# Patient Record
Sex: Female | Born: 1935 | Race: White | Hispanic: No | Smoking: Former smoker
Health system: Southern US, Community
[De-identification: ages and names within clinical notes are randomized; demographics above are authoritative.]

## PROBLEM LIST (undated history)

## (undated) DIAGNOSIS — H409 Unspecified glaucoma: Secondary | ICD-10-CM

## (undated) DIAGNOSIS — I252 Old myocardial infarction: Secondary | ICD-10-CM

## (undated) DIAGNOSIS — I10 Essential (primary) hypertension: Secondary | ICD-10-CM

## (undated) DIAGNOSIS — M1990 Unspecified osteoarthritis, unspecified site: Secondary | ICD-10-CM

## (undated) DIAGNOSIS — K5520 Angiodysplasia of colon without hemorrhage: Secondary | ICD-10-CM

## (undated) DIAGNOSIS — I639 Cerebral infarction, unspecified: Secondary | ICD-10-CM

## (undated) DIAGNOSIS — F0390 Unspecified dementia without behavioral disturbance: Secondary | ICD-10-CM

## (undated) DIAGNOSIS — R970 Elevated carcinoembryonic antigen [CEA]: Secondary | ICD-10-CM

## (undated) DIAGNOSIS — I2510 Atherosclerotic heart disease of native coronary artery without angina pectoris: Secondary | ICD-10-CM

## (undated) DIAGNOSIS — Z8679 Personal history of other diseases of the circulatory system: Secondary | ICD-10-CM

## (undated) DIAGNOSIS — F419 Anxiety disorder, unspecified: Secondary | ICD-10-CM

## (undated) DIAGNOSIS — R918 Other nonspecific abnormal finding of lung field: Secondary | ICD-10-CM

## (undated) DIAGNOSIS — E7800 Pure hypercholesterolemia, unspecified: Secondary | ICD-10-CM

## (undated) DIAGNOSIS — Z8719 Personal history of other diseases of the digestive system: Secondary | ICD-10-CM

## (undated) DIAGNOSIS — D509 Iron deficiency anemia, unspecified: Secondary | ICD-10-CM

## (undated) DIAGNOSIS — Z9289 Personal history of other medical treatment: Secondary | ICD-10-CM

## (undated) DIAGNOSIS — IMO0001 Reserved for inherently not codable concepts without codable children: Secondary | ICD-10-CM

## (undated) DIAGNOSIS — M810 Age-related osteoporosis without current pathological fracture: Secondary | ICD-10-CM

## (undated) DIAGNOSIS — E119 Type 2 diabetes mellitus without complications: Secondary | ICD-10-CM

## (undated) HISTORY — DX: Personal history of other diseases of the circulatory system: Z86.79

## (undated) HISTORY — DX: Cerebral infarction, unspecified: I63.9

## (undated) HISTORY — PX: TONSILLECTOMY: SUR1361

## (undated) HISTORY — DX: Unspecified glaucoma: H40.9

## (undated) HISTORY — PX: COLONOSCOPY: SHX174

## (undated) HISTORY — DX: Other nonspecific abnormal finding of lung field: R91.8

## (undated) HISTORY — DX: Old myocardial infarction: I25.2

## (undated) HISTORY — DX: Angiodysplasia of colon without hemorrhage: K55.20

## (undated) HISTORY — DX: Essential (primary) hypertension: I10

## (undated) HISTORY — DX: Elevated carcinoembryonic antigen (CEA): R97.0

## (undated) HISTORY — PX: THYROIDECTOMY, PARTIAL: SHX18

## (undated) HISTORY — PX: APPENDECTOMY: SHX54

## (undated) HISTORY — DX: Anxiety disorder, unspecified: F41.9

## (undated) HISTORY — DX: Pure hypercholesterolemia, unspecified: E78.00

## (undated) HISTORY — DX: Personal history of other diseases of the digestive system: Z87.19

## (undated) HISTORY — DX: Type 2 diabetes mellitus without complications: E11.9

## (undated) HISTORY — DX: Reserved for inherently not codable concepts without codable children: IMO0001

## (undated) HISTORY — DX: Atherosclerotic heart disease of native coronary artery without angina pectoris: I25.10

## (undated) HISTORY — DX: Iron deficiency anemia, unspecified: D50.9

## (undated) HISTORY — DX: Unspecified osteoarthritis, unspecified site: M19.90

## (undated) HISTORY — PX: POLYPECTOMY: SHX149

## (undated) HISTORY — DX: Age-related osteoporosis without current pathological fracture: M81.0

## (undated) HISTORY — DX: Personal history of other medical treatment: Z92.89

---

## 1988-12-24 HISTORY — PX: OTHER SURGICAL HISTORY: SHX169

## 2004-10-10 ENCOUNTER — Ambulatory Visit: Payer: Self-pay | Admitting: Internal Medicine

## 2004-10-24 ENCOUNTER — Ambulatory Visit: Payer: Self-pay | Admitting: Internal Medicine

## 2004-12-13 ENCOUNTER — Ambulatory Visit: Payer: Self-pay | Admitting: Internal Medicine

## 2004-12-24 ENCOUNTER — Ambulatory Visit: Payer: Self-pay | Admitting: Internal Medicine

## 2005-02-13 ENCOUNTER — Ambulatory Visit: Payer: Self-pay | Admitting: Internal Medicine

## 2005-02-21 ENCOUNTER — Ambulatory Visit: Payer: Self-pay | Admitting: Internal Medicine

## 2005-04-10 ENCOUNTER — Ambulatory Visit: Payer: Self-pay | Admitting: Internal Medicine

## 2005-04-23 ENCOUNTER — Ambulatory Visit: Payer: Self-pay | Admitting: Internal Medicine

## 2005-05-06 ENCOUNTER — Emergency Department: Payer: Self-pay | Admitting: Emergency Medicine

## 2005-06-05 ENCOUNTER — Ambulatory Visit: Payer: Self-pay | Admitting: Internal Medicine

## 2005-06-08 ENCOUNTER — Ambulatory Visit: Payer: Self-pay | Admitting: Internal Medicine

## 2005-06-23 ENCOUNTER — Ambulatory Visit: Payer: Self-pay | Admitting: Internal Medicine

## 2005-08-14 ENCOUNTER — Ambulatory Visit: Payer: Self-pay | Admitting: Internal Medicine

## 2005-08-24 ENCOUNTER — Ambulatory Visit: Payer: Self-pay | Admitting: Internal Medicine

## 2005-10-09 ENCOUNTER — Ambulatory Visit: Payer: Self-pay | Admitting: Internal Medicine

## 2005-10-16 ENCOUNTER — Ambulatory Visit: Payer: Self-pay | Admitting: Internal Medicine

## 2005-10-22 ENCOUNTER — Ambulatory Visit: Payer: Self-pay | Admitting: Internal Medicine

## 2005-10-24 ENCOUNTER — Ambulatory Visit: Payer: Self-pay | Admitting: Internal Medicine

## 2005-11-23 ENCOUNTER — Ambulatory Visit: Payer: Self-pay | Admitting: Internal Medicine

## 2005-12-28 ENCOUNTER — Ambulatory Visit: Payer: Self-pay | Admitting: Unknown Physician Specialty

## 2005-12-31 ENCOUNTER — Ambulatory Visit: Payer: Self-pay | Admitting: Unknown Physician Specialty

## 2006-01-14 ENCOUNTER — Ambulatory Visit: Payer: Self-pay | Admitting: Internal Medicine

## 2006-01-23 ENCOUNTER — Ambulatory Visit: Payer: Self-pay | Admitting: Unknown Physician Specialty

## 2006-01-24 ENCOUNTER — Ambulatory Visit: Payer: Self-pay | Admitting: Internal Medicine

## 2006-02-21 ENCOUNTER — Ambulatory Visit: Payer: Self-pay | Admitting: Internal Medicine

## 2006-04-03 ENCOUNTER — Ambulatory Visit: Payer: Self-pay | Admitting: Internal Medicine

## 2006-04-23 ENCOUNTER — Ambulatory Visit: Payer: Self-pay | Admitting: Internal Medicine

## 2006-06-19 ENCOUNTER — Ambulatory Visit: Payer: Self-pay | Admitting: Internal Medicine

## 2006-06-23 ENCOUNTER — Ambulatory Visit: Payer: Self-pay | Admitting: Internal Medicine

## 2008-06-23 ENCOUNTER — Ambulatory Visit: Payer: Self-pay | Admitting: Internal Medicine

## 2008-07-01 ENCOUNTER — Ambulatory Visit: Payer: Self-pay | Admitting: Internal Medicine

## 2008-07-24 ENCOUNTER — Ambulatory Visit: Payer: Self-pay | Admitting: Internal Medicine

## 2008-08-24 ENCOUNTER — Ambulatory Visit: Payer: Self-pay | Admitting: Internal Medicine

## 2008-09-23 ENCOUNTER — Ambulatory Visit: Payer: Self-pay | Admitting: Internal Medicine

## 2008-10-24 ENCOUNTER — Ambulatory Visit: Payer: Self-pay | Admitting: Internal Medicine

## 2008-11-23 ENCOUNTER — Ambulatory Visit: Payer: Self-pay | Admitting: Internal Medicine

## 2008-12-24 ENCOUNTER — Ambulatory Visit: Payer: Self-pay | Admitting: Internal Medicine

## 2009-01-24 ENCOUNTER — Ambulatory Visit: Payer: Self-pay | Admitting: Internal Medicine

## 2009-02-21 ENCOUNTER — Ambulatory Visit: Payer: Self-pay | Admitting: Internal Medicine

## 2009-03-24 ENCOUNTER — Ambulatory Visit: Payer: Self-pay | Admitting: Internal Medicine

## 2009-04-23 ENCOUNTER — Ambulatory Visit: Payer: Self-pay | Admitting: Internal Medicine

## 2009-05-24 ENCOUNTER — Ambulatory Visit: Payer: Self-pay | Admitting: Internal Medicine

## 2009-06-23 ENCOUNTER — Ambulatory Visit: Payer: Self-pay | Admitting: Internal Medicine

## 2009-07-24 ENCOUNTER — Ambulatory Visit: Payer: Self-pay | Admitting: Internal Medicine

## 2009-08-24 ENCOUNTER — Ambulatory Visit: Payer: Self-pay | Admitting: Internal Medicine

## 2009-09-23 ENCOUNTER — Ambulatory Visit: Payer: Self-pay | Admitting: Internal Medicine

## 2009-10-24 ENCOUNTER — Ambulatory Visit: Payer: Self-pay | Admitting: Internal Medicine

## 2009-11-23 ENCOUNTER — Ambulatory Visit: Payer: Self-pay | Admitting: Internal Medicine

## 2009-12-24 ENCOUNTER — Ambulatory Visit: Payer: Self-pay | Admitting: Internal Medicine

## 2010-01-24 ENCOUNTER — Ambulatory Visit: Payer: Self-pay | Admitting: Internal Medicine

## 2010-02-21 ENCOUNTER — Ambulatory Visit: Payer: Self-pay | Admitting: Internal Medicine

## 2010-03-24 ENCOUNTER — Ambulatory Visit: Payer: Self-pay | Admitting: Internal Medicine

## 2010-04-23 ENCOUNTER — Ambulatory Visit: Payer: Self-pay | Admitting: Internal Medicine

## 2010-05-24 ENCOUNTER — Ambulatory Visit: Payer: Self-pay | Admitting: Internal Medicine

## 2010-06-23 ENCOUNTER — Ambulatory Visit: Payer: Self-pay | Admitting: Internal Medicine

## 2010-07-24 ENCOUNTER — Ambulatory Visit: Payer: Self-pay | Admitting: Internal Medicine

## 2010-08-04 ENCOUNTER — Inpatient Hospital Stay: Payer: Self-pay | Admitting: Internal Medicine

## 2010-08-09 ENCOUNTER — Ambulatory Visit: Payer: Self-pay | Admitting: Internal Medicine

## 2010-08-24 ENCOUNTER — Ambulatory Visit: Payer: Self-pay | Admitting: Internal Medicine

## 2010-09-23 ENCOUNTER — Ambulatory Visit: Payer: Self-pay | Admitting: Internal Medicine

## 2010-10-24 ENCOUNTER — Ambulatory Visit: Payer: Self-pay | Admitting: Internal Medicine

## 2010-11-13 ENCOUNTER — Ambulatory Visit: Payer: Self-pay

## 2010-11-23 ENCOUNTER — Ambulatory Visit: Payer: Self-pay | Admitting: Internal Medicine

## 2010-12-24 ENCOUNTER — Ambulatory Visit: Payer: Self-pay | Admitting: Internal Medicine

## 2011-01-24 ENCOUNTER — Ambulatory Visit: Payer: Self-pay | Admitting: Internal Medicine

## 2011-02-22 ENCOUNTER — Ambulatory Visit: Payer: Self-pay | Admitting: Internal Medicine

## 2011-03-25 ENCOUNTER — Ambulatory Visit: Payer: Self-pay | Admitting: Internal Medicine

## 2011-04-24 ENCOUNTER — Ambulatory Visit: Payer: Self-pay | Admitting: Internal Medicine

## 2011-04-27 DIAGNOSIS — Z9289 Personal history of other medical treatment: Secondary | ICD-10-CM

## 2011-04-27 HISTORY — DX: Personal history of other medical treatment: Z92.89

## 2011-05-25 ENCOUNTER — Ambulatory Visit: Payer: Self-pay | Admitting: Internal Medicine

## 2011-06-24 ENCOUNTER — Ambulatory Visit: Payer: Self-pay | Admitting: Internal Medicine

## 2011-07-25 ENCOUNTER — Ambulatory Visit: Payer: Self-pay | Admitting: Internal Medicine

## 2011-08-25 ENCOUNTER — Ambulatory Visit: Payer: Self-pay | Admitting: Internal Medicine

## 2011-09-24 ENCOUNTER — Ambulatory Visit: Payer: Self-pay | Admitting: Internal Medicine

## 2011-10-08 ENCOUNTER — Ambulatory Visit: Payer: Self-pay | Admitting: Internal Medicine

## 2011-10-25 ENCOUNTER — Ambulatory Visit: Payer: Self-pay | Admitting: Internal Medicine

## 2011-11-24 ENCOUNTER — Ambulatory Visit: Payer: Self-pay | Admitting: Internal Medicine

## 2011-12-25 ENCOUNTER — Ambulatory Visit: Payer: Self-pay | Admitting: Internal Medicine

## 2011-12-26 LAB — CANCER CENTER HEMOGLOBIN: HGB: 11.4 g/dL — ABNORMAL LOW (ref 12.0–16.0)

## 2012-01-09 LAB — FERRITIN: Ferritin (ARMC): 14 ng/mL (ref 8–388)

## 2012-01-25 ENCOUNTER — Ambulatory Visit: Payer: Self-pay | Admitting: Internal Medicine

## 2012-02-05 ENCOUNTER — Ambulatory Visit: Payer: Self-pay | Admitting: Internal Medicine

## 2012-02-05 LAB — CANCER CENTER HEMOGLOBIN: HGB: 10.1 g/dL — ABNORMAL LOW (ref 12.0–16.0)

## 2012-02-12 LAB — CANCER CENTER HEMOGLOBIN: HGB: 11.1 g/dL — ABNORMAL LOW (ref 12.0–16.0)

## 2012-02-18 LAB — URINALYSIS, COMPLETE
Bilirubin,UR: NEGATIVE
Nitrite: NEGATIVE
Ph: 6 (ref 4.5–8.0)
Protein: 30
Specific Gravity: >=1.03 (ref 1.003–1.030)

## 2012-02-18 LAB — CANCER CENTER HEMOGLOBIN: HGB: 11.3 g/dL — ABNORMAL LOW (ref 12.0–16.0)

## 2012-02-19 LAB — URINE CULTURE

## 2012-02-21 LAB — COMPREHENSIVE METABOLIC PANEL
Alkaline Phosphatase: 81 U/L (ref 50–136)
Anion Gap: 9 (ref 7–16)
BUN: 20 mg/dL — ABNORMAL HIGH (ref 7–18)
Bilirubin,Total: 0.3 mg/dL (ref 0.2–1.0)
Calcium, Total: 9.4 mg/dL (ref 8.5–10.1)
Chloride: 98 mmol/L (ref 98–107)
Co2: 27 mmol/L (ref 21–32)
Creatinine: 0.99 mg/dL (ref 0.60–1.30)
EGFR (African American): >60
EGFR (Non-African Amer.): 56 — ABNORMAL LOW
Osmolality: 273 (ref 275–301)
SGOT(AST): 164 U/L — ABNORMAL HIGH (ref 15–37)
Sodium: 134 mmol/L — ABNORMAL LOW (ref 136–145)

## 2012-02-22 ENCOUNTER — Ambulatory Visit: Payer: Self-pay | Admitting: Internal Medicine

## 2012-02-22 ENCOUNTER — Inpatient Hospital Stay: Payer: Self-pay | Admitting: Internal Medicine

## 2012-02-22 LAB — CBC
HCT: 26.9 % — ABNORMAL LOW (ref 35.0–47.0)
HGB: 8.9 g/dL — ABNORMAL LOW (ref 12.0–16.0)
MCH: 30.4 pg (ref 26.0–34.0)
MCV: 92 fL (ref 80–100)
Platelet: 130 10*3/uL — ABNORMAL LOW (ref 150–440)
RDW: 20.5 % — ABNORMAL HIGH (ref 11.5–14.5)
WBC: 2 10*3/uL — CL (ref 3.6–11.0)

## 2012-02-22 LAB — PROTIME-INR
INR: 1.1
Prothrombin Time: 14.1 secs (ref 11.5–14.7)

## 2012-02-22 LAB — DIFFERENTIAL
Basophil %: 0 %
Eosinophil #: 0 10*3/uL (ref 0.0–0.7)
Eosinophil %: 0 %
Lymphocyte #: 0.3 10*3/uL — ABNORMAL LOW (ref 1.0–3.6)
Lymphocyte %: 15.6 %
Monocyte #: 0.2 10*3/uL (ref 0.0–0.7)
Neutrophil #: 1.5 10*3/uL (ref 1.4–6.5)
Neutrophil %: 72.3 %

## 2012-02-22 LAB — TROPONIN I
Troponin-I: <0.02 ng/mL
Troponin-I: <0.02 ng/mL

## 2012-02-22 LAB — HEMOGLOBIN: HGB: 9.8 g/dL — ABNORMAL LOW (ref 12.0–16.0)

## 2012-02-22 LAB — CK TOTAL AND CKMB (NOT AT ARMC): CK-MB: <0.5 ng/mL — ABNORMAL LOW (ref 0.5–3.6)

## 2012-02-23 LAB — COMPREHENSIVE METABOLIC PANEL
Bilirubin,Total: 0.3 mg/dL (ref 0.2–1.0)
Chloride: 107 mmol/L (ref 98–107)
Co2: 24 mmol/L (ref 21–32)
Creatinine: 0.82 mg/dL (ref 0.60–1.30)
EGFR (African American): >60
EGFR (Non-African Amer.): >60
Osmolality: 285 (ref 275–301)
Potassium: 3.8 mmol/L (ref 3.5–5.1)
SGOT(AST): 162 U/L — ABNORMAL HIGH (ref 15–37)
SGPT (ALT): 130 U/L — ABNORMAL HIGH
Total Protein: 5.3 g/dL — ABNORMAL LOW (ref 6.4–8.2)

## 2012-02-23 LAB — CBC WITH DIFFERENTIAL/PLATELET
Basophil #: 0 10*3/uL (ref 0.0–0.1)
Basophil %: 0 %
HCT: 26.6 % — ABNORMAL LOW (ref 35.0–47.0)
Lymphocyte #: 0.2 10*3/uL — ABNORMAL LOW (ref 1.0–3.6)
Lymphocyte %: 11 %
MCH: 30.3 pg (ref 26.0–34.0)
MCHC: 33 g/dL (ref 32.0–36.0)
MCV: 92 fL (ref 80–100)
Monocyte #: 0.2 10*3/uL (ref 0.0–0.7)
Neutrophil #: 1.6 10*3/uL (ref 1.4–6.5)
Platelet: 146 10*3/uL — ABNORMAL LOW (ref 150–440)
RBC: 2.89 10*6/uL — ABNORMAL LOW (ref 3.80–5.20)
RDW: 19.3 % — ABNORMAL HIGH (ref 11.5–14.5)
WBC: 2 10*3/uL — CL (ref 3.6–11.0)

## 2012-02-23 LAB — URINALYSIS, COMPLETE
Blood: NEGATIVE
Ketone: NEGATIVE
Ph: 5 (ref 4.5–8.0)
Protein: NEGATIVE
RBC,UR: NONE SEEN /HPF (ref 0–5)
Specific Gravity: 1.015 (ref 1.003–1.030)
WBC UR: 4 /HPF (ref 0–5)

## 2012-02-24 LAB — CBC WITH DIFFERENTIAL/PLATELET
Basophil #: 0 10*3/uL (ref 0.0–0.1)
Eosinophil %: 0 %
HCT: 26.3 % — ABNORMAL LOW (ref 35.0–47.0)
HGB: 8.6 g/dL — ABNORMAL LOW (ref 12.0–16.0)
Lymphocyte #: 0.3 10*3/uL — ABNORMAL LOW (ref 1.0–3.6)
Lymphocyte %: 11.2 %
MCH: 30.3 pg (ref 26.0–34.0)
Monocyte #: 0.2 10*3/uL (ref 0.0–0.7)
Monocyte %: 8.9 %
Neutrophil #: 2.1 10*3/uL (ref 1.4–6.5)
Neutrophil %: 79.8 %
RBC: 2.85 10*6/uL — ABNORMAL LOW (ref 3.80–5.20)
RDW: 19.5 % — ABNORMAL HIGH (ref 11.5–14.5)

## 2012-02-24 LAB — URINE CULTURE

## 2012-02-24 LAB — COMPREHENSIVE METABOLIC PANEL
Alkaline Phosphatase: 92 U/L (ref 50–136)
Calcium, Total: 7.9 mg/dL — ABNORMAL LOW (ref 8.5–10.1)
Co2: 23 mmol/L (ref 21–32)
EGFR (Non-African Amer.): >60
Osmolality: 280 (ref 275–301)
SGOT(AST): 134 U/L — ABNORMAL HIGH (ref 15–37)
SGPT (ALT): 124 U/L — ABNORMAL HIGH

## 2012-02-25 LAB — COMPREHENSIVE METABOLIC PANEL
Albumin: 1.9 g/dL — ABNORMAL LOW (ref 3.4–5.0)
Alkaline Phosphatase: 91 U/L (ref 50–136)
BUN: 13 mg/dL (ref 7–18)
Calcium, Total: 7.9 mg/dL — ABNORMAL LOW (ref 8.5–10.1)
EGFR (African American): >60
EGFR (Non-African Amer.): >60
Glucose: 224 mg/dL — ABNORMAL HIGH (ref 65–99)
Potassium: 4 mmol/L (ref 3.5–5.1)
SGOT(AST): 84 U/L — ABNORMAL HIGH (ref 15–37)
SGPT (ALT): 100 U/L — ABNORMAL HIGH
Sodium: 142 mmol/L (ref 136–145)
Total Protein: 5.3 g/dL — ABNORMAL LOW (ref 6.4–8.2)

## 2012-02-25 LAB — CBC WITH DIFFERENTIAL/PLATELET
Basophil #: 0 10*3/uL (ref 0.0–0.1)
Basophil %: 0 %
HCT: 25.2 % — ABNORMAL LOW (ref 35.0–47.0)
HGB: 8.2 g/dL — ABNORMAL LOW (ref 12.0–16.0)
Lymphocyte %: 7.7 %
MCHC: 32.7 g/dL (ref 32.0–36.0)
MCV: 91 fL (ref 80–100)
Monocyte %: 1.6 %
Neutrophil #: 2.7 10*3/uL (ref 1.4–6.5)
Neutrophil %: 90.7 %

## 2012-02-26 LAB — CBC WITH DIFFERENTIAL/PLATELET
Basophil #: 0 10*3/uL (ref 0.0–0.1)
Basophil %: 0.3 %
Eosinophil #: 0 10*3/uL (ref 0.0–0.7)
HCT: 28 % — ABNORMAL LOW (ref 35.0–47.0)
HGB: 8.8 g/dL — ABNORMAL LOW (ref 12.0–16.0)
Lymphocyte #: 0.4 10*3/uL — ABNORMAL LOW (ref 1.0–3.6)
Lymphocyte %: 6.3 %
MCH: 29.2 pg (ref 26.0–34.0)
MCHC: 31.6 g/dL — ABNORMAL LOW (ref 32.0–36.0)
MCV: 93 fL (ref 80–100)
Neutrophil #: 5.8 10*3/uL (ref 1.4–6.5)
Platelet: 284 10*3/uL (ref 150–440)
RDW: 18.7 % — ABNORMAL HIGH (ref 11.5–14.5)
WBC: 6.3 10*3/uL (ref 3.6–11.0)

## 2012-02-26 LAB — COMPREHENSIVE METABOLIC PANEL
Albumin: 2.1 g/dL — ABNORMAL LOW (ref 3.4–5.0)
Alkaline Phosphatase: 92 U/L (ref 50–136)
Anion Gap: 9 (ref 7–16)
Calcium, Total: 8.9 mg/dL (ref 8.5–10.1)
Chloride: 109 mmol/L — ABNORMAL HIGH (ref 98–107)
EGFR (African American): >60
Glucose: 226 mg/dL — ABNORMAL HIGH (ref 65–99)
Potassium: 3.8 mmol/L (ref 3.5–5.1)
SGOT(AST): 79 U/L — ABNORMAL HIGH (ref 15–37)
SGPT (ALT): 110 U/L — ABNORMAL HIGH
Sodium: 141 mmol/L (ref 136–145)
Total Protein: 5.7 g/dL — ABNORMAL LOW (ref 6.4–8.2)

## 2012-02-27 LAB — CBC WITH DIFFERENTIAL/PLATELET
Basophil %: 0.3 %
Eosinophil #: 0 10*3/uL (ref 0.0–0.7)
Eosinophil %: 0.2 %
HCT: 28.1 % — ABNORMAL LOW (ref 35.0–47.0)
HGB: 8.8 g/dL — ABNORMAL LOW (ref 12.0–16.0)
Lymphocyte %: 3.4 %
MCV: 91 fL (ref 80–100)
Monocyte #: 0.2 10*3/uL (ref 0.0–0.7)
Neutrophil #: 7 10*3/uL — ABNORMAL HIGH (ref 1.4–6.5)
Neutrophil %: 93.7 %
Platelet: 384 10*3/uL (ref 150–440)
RBC: 3.08 10*6/uL — ABNORMAL LOW (ref 3.80–5.20)
RDW: 18.5 % — ABNORMAL HIGH (ref 11.5–14.5)
WBC: 7.5 10*3/uL (ref 3.6–11.0)

## 2012-02-27 LAB — BASIC METABOLIC PANEL
BUN: 23 mg/dL — ABNORMAL HIGH (ref 7–18)
Calcium, Total: 8.9 mg/dL (ref 8.5–10.1)
Co2: 22 mmol/L (ref 21–32)
Creatinine: 0.67 mg/dL (ref 0.60–1.30)
EGFR (African American): >60
Glucose: 193 mg/dL — ABNORMAL HIGH (ref 65–99)
Osmolality: 290 (ref 275–301)
Potassium: 4 mmol/L (ref 3.5–5.1)

## 2012-02-27 LAB — HEPATIC FUNCTION PANEL A (ARMC)
Albumin: 2.1 g/dL — ABNORMAL LOW (ref 3.4–5.0)
Bilirubin, Direct: <0.1 mg/dL (ref 0.00–0.20)
Total Protein: 5.4 g/dL — ABNORMAL LOW (ref 6.4–8.2)

## 2012-02-28 ENCOUNTER — Ambulatory Visit: Payer: Self-pay | Admitting: Internal Medicine

## 2012-02-29 LAB — CULTURE, BLOOD (SINGLE)

## 2012-03-04 LAB — FERRITIN: Ferritin (ARMC): 200 ng/mL (ref 8–388)

## 2012-03-11 LAB — URINALYSIS, COMPLETE
Bilirubin,UR: NEGATIVE
Glucose,UR: NEGATIVE mg/dL (ref 0–75)
Hyaline Cast: >30
Ketone: NEGATIVE
Leukocyte Esterase: NEGATIVE
Nitrite: NEGATIVE

## 2012-03-11 LAB — HEPATIC FUNCTION PANEL A (ARMC)
Bilirubin,Total: 0.5 mg/dL (ref 0.2–1.0)
SGOT(AST): 24 U/L (ref 15–37)
SGPT (ALT): 46 U/L

## 2012-03-11 LAB — CANCER CENTER HEMOGLOBIN: HGB: 11.8 g/dL — ABNORMAL LOW (ref 12.0–16.0)

## 2012-03-11 LAB — FERRITIN: Ferritin (ARMC): 93 ng/mL (ref 8–388)

## 2012-03-18 LAB — URINALYSIS, COMPLETE
Bacteria: NEGATIVE
Bilirubin,UR: NEGATIVE
Blood: NEGATIVE
Ketone: NEGATIVE
Leukocyte Esterase: NEGATIVE
Nitrite: NEGATIVE
Ph: 7.5 (ref 4.5–8.0)
Protein: 30

## 2012-03-18 LAB — CANCER CENTER HEMOGLOBIN: HGB: 11.3 g/dL — ABNORMAL LOW (ref 12.0–16.0)

## 2012-03-24 ENCOUNTER — Ambulatory Visit: Payer: Self-pay | Admitting: Internal Medicine

## 2012-03-27 LAB — CANCER CENTER HEMOGLOBIN: HGB: 10.6 g/dL — ABNORMAL LOW (ref 12.0–16.0)

## 2012-04-08 LAB — CANCER CENTER HEMOGLOBIN: HGB: 10 g/dL — ABNORMAL LOW (ref 12.0–16.0)

## 2012-04-08 LAB — FERRITIN: Ferritin (ARMC): 44 ng/mL (ref 8–388)

## 2012-04-15 LAB — URINALYSIS, COMPLETE
Bacteria: NEGATIVE
Bilirubin,UR: NEGATIVE
Blood: NEGATIVE
Glucose,UR: NEGATIVE mg/dL (ref 0–75)
Protein: NEGATIVE
Specific Gravity: 1.02 (ref 1.003–1.030)

## 2012-04-15 LAB — CBC CANCER CENTER
Basophil %: 0.7 %
Eosinophil #: 0.1 x10 3/mm (ref 0.0–0.7)
HGB: 10.8 g/dL — ABNORMAL LOW (ref 12.0–16.0)
MCH: 29.1 pg (ref 26.0–34.0)
MCHC: 32.4 g/dL (ref 32.0–36.0)
Monocyte #: 0.6 x10 3/mm (ref 0.2–0.9)
Platelet: 316 x10 3/mm (ref 150–440)
RBC: 3.72 10*6/uL — ABNORMAL LOW (ref 3.80–5.20)
WBC: 5.2 x10 3/mm (ref 3.6–11.0)

## 2012-04-23 ENCOUNTER — Ambulatory Visit: Payer: Self-pay | Admitting: Gastroenterology

## 2012-04-24 ENCOUNTER — Ambulatory Visit: Payer: Self-pay | Admitting: Internal Medicine

## 2012-04-24 LAB — PATHOLOGY REPORT

## 2012-05-13 LAB — CANCER CENTER HEMOGLOBIN: HGB: 11.7 g/dL — ABNORMAL LOW

## 2012-05-24 ENCOUNTER — Ambulatory Visit: Payer: Self-pay | Admitting: Internal Medicine

## 2012-05-27 LAB — CANCER CENTER HEMOGLOBIN: HGB: 11.2 g/dL — ABNORMAL LOW (ref 12.0–16.0)

## 2012-06-10 ENCOUNTER — Ambulatory Visit: Payer: Self-pay | Admitting: Internal Medicine

## 2012-06-10 LAB — CANCER CENTER HEMOGLOBIN: HGB: 11.5 g/dL — ABNORMAL LOW (ref 12.0–16.0)

## 2012-06-23 ENCOUNTER — Ambulatory Visit: Payer: Self-pay | Admitting: Internal Medicine

## 2012-07-01 LAB — FERRITIN: Ferritin (ARMC): 13 ng/mL (ref 8–388)

## 2012-07-01 LAB — CANCER CENTER HEMOGLOBIN: HGB: 11.5 g/dL — ABNORMAL LOW (ref 12.0–16.0)

## 2012-07-22 LAB — CANCER CENTER HEMOGLOBIN: HGB: 11.3 g/dL — ABNORMAL LOW (ref 12.0–16.0)

## 2012-07-22 LAB — URINALYSIS, COMPLETE
Bacteria: NEGATIVE
Bilirubin,UR: NEGATIVE
Blood: NEGATIVE
Dysmorphic Rbc: NONE SEEN
Glucose,UR: NEGATIVE mg/dL (ref 0–75)
Ketone: NEGATIVE
Nitrite: NEGATIVE
Ph: 7 (ref 4.5–8.0)
Protein: NEGATIVE
RBC Clump: NONE SEEN
Renal Epithelial: NONE SEEN
Specific Gravity: 1.02 (ref 1.003–1.030)
Transitional Epi: NONE SEEN
WBC Clump: NONE SEEN

## 2012-07-23 LAB — URINE CULTURE

## 2012-07-24 ENCOUNTER — Ambulatory Visit: Payer: Self-pay

## 2012-07-24 ENCOUNTER — Ambulatory Visit: Payer: Self-pay | Admitting: Internal Medicine

## 2012-08-12 LAB — CBC CANCER CENTER
Basophil #: 0 x10 3/mm (ref 0.0–0.1)
Eosinophil #: 0.1 x10 3/mm (ref 0.0–0.7)
Eosinophil %: 3.2 %
HGB: 12 g/dL (ref 12.0–16.0)
Lymphocyte #: 0.7 x10 3/mm — ABNORMAL LOW (ref 1.0–3.6)
Lymphocyte %: 23 %
MCV: 88 fL (ref 80–100)
Monocyte %: 10 %
Neutrophil %: 62.5 %
Platelet: 177 x10 3/mm (ref 150–440)
RBC: 4.13 10*6/uL (ref 3.80–5.20)
RDW: 19.2 % — ABNORMAL HIGH (ref 11.5–14.5)
WBC: 3 x10 3/mm — ABNORMAL LOW (ref 3.6–11.0)

## 2012-08-24 ENCOUNTER — Ambulatory Visit: Payer: Self-pay | Admitting: Internal Medicine

## 2012-09-02 LAB — HEMOGLOBIN A1C: Hemoglobin A1C: 5.8 % (ref 4.2–6.3)

## 2012-09-02 LAB — BASIC METABOLIC PANEL
Calcium, Total: 9.8 mg/dL (ref 8.5–10.1)
Co2: 31 mmol/L (ref 21–32)
EGFR (Non-African Amer.): 50 — ABNORMAL LOW
Glucose: 137 mg/dL — ABNORMAL HIGH (ref 65–99)
Osmolality: 285 (ref 275–301)
Potassium: 3.7 mmol/L (ref 3.5–5.1)

## 2012-09-02 LAB — CBC CANCER CENTER
Basophil #: 0 x10 3/mm (ref 0.0–0.1)
Basophil %: 0.9 %
Eosinophil #: 0.1 x10 3/mm (ref 0.0–0.7)
Eosinophil %: 2.8 %
HCT: 39.5 % (ref 35.0–47.0)
HGB: 13 g/dL (ref 12.0–16.0)
Lymphocyte #: 0.7 x10 3/mm — ABNORMAL LOW (ref 1.0–3.6)
Lymphocyte %: 18.9 %
MCH: 29.3 pg (ref 26.0–34.0)
MCHC: 33 g/dL (ref 32.0–36.0)
Monocyte #: 0.4 x10 3/mm (ref 0.2–0.9)
Neutrophil #: 2.5 x10 3/mm (ref 1.4–6.5)
Neutrophil %: 66.1 %
RDW: 18.5 % — ABNORMAL HIGH (ref 11.5–14.5)
WBC: 3.7 x10 3/mm (ref 3.6–11.0)

## 2012-09-02 LAB — FERRITIN: Ferritin (ARMC): 47 ng/mL (ref 8–388)

## 2012-09-23 ENCOUNTER — Ambulatory Visit: Payer: Self-pay

## 2012-09-23 ENCOUNTER — Ambulatory Visit: Payer: Self-pay | Admitting: Internal Medicine

## 2012-09-23 LAB — CBC CANCER CENTER
Basophil #: 0 x10 3/mm (ref 0.0–0.1)
Eosinophil #: 0.1 x10 3/mm (ref 0.0–0.7)
HCT: 38.8 % (ref 35.0–47.0)
Lymphocyte #: 0.9 x10 3/mm — ABNORMAL LOW (ref 1.0–3.6)
MCHC: 33.4 g/dL (ref 32.0–36.0)
Monocyte #: 0.4 x10 3/mm (ref 0.2–0.9)
Neutrophil %: 60.6 %
Platelet: 172 x10 3/mm (ref 150–440)
RDW: 17.7 % — ABNORMAL HIGH (ref 11.5–14.5)

## 2012-10-14 LAB — CBC CANCER CENTER
Basophil %: 1 %
Eosinophil #: 0.1 x10 3/mm (ref 0.0–0.7)
Eosinophil %: 2.9 %
HCT: 39 % (ref 35.0–47.0)
HGB: 13.2 g/dL (ref 12.0–16.0)
Lymphocyte #: 1 x10 3/mm (ref 1.0–3.6)
Lymphocyte %: 22.5 %
MCHC: 33.8 g/dL (ref 32.0–36.0)
MCV: 89 fL (ref 80–100)
Monocyte %: 11.1 %
Neutrophil #: 2.9 x10 3/mm (ref 1.4–6.5)
RDW: 15.9 % — ABNORMAL HIGH (ref 11.5–14.5)
WBC: 4.7 x10 3/mm (ref 3.6–11.0)

## 2012-10-14 LAB — FERRITIN: Ferritin (ARMC): 16 ng/mL (ref 8–388)

## 2012-10-24 ENCOUNTER — Ambulatory Visit: Payer: Self-pay | Admitting: Internal Medicine

## 2012-11-23 ENCOUNTER — Ambulatory Visit: Payer: Self-pay | Admitting: Internal Medicine

## 2012-12-02 LAB — CANCER CENTER HEMOGLOBIN: HGB: 11.6 g/dL — ABNORMAL LOW (ref 12.0–16.0)

## 2012-12-24 ENCOUNTER — Ambulatory Visit: Payer: Self-pay | Admitting: Internal Medicine

## 2013-01-13 ENCOUNTER — Ambulatory Visit: Payer: Self-pay | Admitting: Internal Medicine

## 2013-01-13 LAB — BASIC METABOLIC PANEL
Anion Gap: 9 (ref 7–16)
Calcium, Total: 9.7 mg/dL (ref 8.5–10.1)
Chloride: 106 mmol/L (ref 98–107)
Creatinine: 0.95 mg/dL (ref 0.60–1.30)
EGFR (African American): >60
EGFR (Non-African Amer.): 53 — ABNORMAL LOW
Glucose: 125 mg/dL — ABNORMAL HIGH (ref 65–99)
Potassium: 4.4 mmol/L (ref 3.5–5.1)

## 2013-01-13 LAB — HEPATIC FUNCTION PANEL A (ARMC)
Alkaline Phosphatase: 97 U/L (ref 50–136)
Bilirubin, Direct: 0.1 mg/dL (ref 0.00–0.20)
Bilirubin,Total: 0.3 mg/dL (ref 0.2–1.0)

## 2013-01-13 LAB — FERRITIN: Ferritin (ARMC): 41 ng/mL (ref 8–388)

## 2013-01-13 LAB — LIPID PANEL
Cholesterol: 167 mg/dL (ref 0–200)
HDL Cholesterol: 63 mg/dL — ABNORMAL HIGH (ref 40–60)

## 2013-01-13 LAB — CANCER CENTER HEMOGLOBIN: HGB: 12 g/dL (ref 12.0–16.0)

## 2013-01-24 ENCOUNTER — Ambulatory Visit: Payer: Self-pay | Admitting: Internal Medicine

## 2013-02-03 LAB — FERRITIN: Ferritin (ARMC): 19 ng/mL (ref 8–388)

## 2013-02-21 ENCOUNTER — Ambulatory Visit: Payer: Self-pay | Admitting: Internal Medicine

## 2013-03-03 LAB — CANCER CENTER HEMOGLOBIN: HGB: 12.1 g/dL (ref 12.0–16.0)

## 2013-03-17 LAB — CANCER CENTER HEMOGLOBIN: HGB: 12.9 g/dL (ref 12.0–16.0)

## 2013-03-24 ENCOUNTER — Ambulatory Visit: Payer: Self-pay | Admitting: Internal Medicine

## 2013-03-31 LAB — CANCER CENTER HEMOGLOBIN: HGB: 12.4 g/dL (ref 12.0–16.0)

## 2013-04-23 ENCOUNTER — Ambulatory Visit: Payer: Self-pay | Admitting: Internal Medicine

## 2013-05-19 LAB — CANCER CENTER HEMOGLOBIN: HGB: 10.2 g/dL — ABNORMAL LOW (ref 12.0–16.0)

## 2013-05-24 ENCOUNTER — Ambulatory Visit: Payer: Self-pay | Admitting: Internal Medicine

## 2013-06-02 LAB — FERRITIN: Ferritin (ARMC): 213 ng/mL (ref 8–388)

## 2013-06-10 LAB — CANCER CENTER HEMOGLOBIN: HGB: 11.1 g/dL — ABNORMAL LOW (ref 12.0–16.0)

## 2013-06-23 ENCOUNTER — Ambulatory Visit: Payer: Self-pay | Admitting: Internal Medicine

## 2013-07-07 LAB — HEMOGLOBIN: HGB: 11.1 g/dL — ABNORMAL LOW (ref 12.0–16.0)

## 2013-07-07 LAB — FERRITIN: Ferritin (ARMC): 393 ng/mL — ABNORMAL HIGH (ref 8–388)

## 2013-07-21 LAB — FERRITIN: Ferritin (ARMC): 114 ng/mL (ref 8–388)

## 2013-07-21 LAB — CANCER CENTER HEMOGLOBIN: HGB: 10.7 g/dL — ABNORMAL LOW (ref 12.0–16.0)

## 2013-07-24 ENCOUNTER — Ambulatory Visit: Payer: Self-pay | Admitting: Internal Medicine

## 2013-08-04 LAB — CBC CANCER CENTER
Basophil #: 0 x10 3/mm (ref 0.0–0.1)
Basophil %: 0.8 %
Eosinophil #: 0.1 x10 3/mm (ref 0.0–0.7)
Eosinophil %: 2.8 %
HCT: 33.2 % — ABNORMAL LOW (ref 35.0–47.0)
Lymphocyte %: 15.1 %
MCH: 30.6 pg (ref 26.0–34.0)
MCHC: 33.7 g/dL (ref 32.0–36.0)
Monocyte #: 0.4 x10 3/mm (ref 0.2–0.9)
Monocyte %: 8.7 %
Neutrophil %: 72.6 %
RDW: 16.9 % — ABNORMAL HIGH (ref 11.5–14.5)
WBC: 4.8 x10 3/mm (ref 3.6–11.0)

## 2013-08-18 LAB — CANCER CENTER HEMOGLOBIN: HGB: 11.9 g/dL — ABNORMAL LOW (ref 12.0–16.0)

## 2013-08-24 ENCOUNTER — Ambulatory Visit: Payer: Self-pay | Admitting: Internal Medicine

## 2013-09-01 LAB — CBC CANCER CENTER
Basophil #: 0 x10 3/mm (ref 0.0–0.1)
Eosinophil #: 0.2 x10 3/mm (ref 0.0–0.7)
Eosinophil %: 3.4 %
HCT: 34.9 % — ABNORMAL LOW (ref 35.0–47.0)
HGB: 11.7 g/dL — ABNORMAL LOW (ref 12.0–16.0)
Lymphocyte %: 18.3 %
MCH: 29.6 pg (ref 26.0–34.0)
MCHC: 33.6 g/dL (ref 32.0–36.0)
Monocyte #: 0.4 x10 3/mm (ref 0.2–0.9)
Monocyte %: 8.7 %
Neutrophil #: 3.3 x10 3/mm (ref 1.4–6.5)

## 2013-09-01 LAB — FERRITIN: Ferritin (ARMC): 20 ng/mL (ref 8–388)

## 2013-09-15 LAB — CANCER CENTER HEMOGLOBIN: HGB: 11 g/dL — ABNORMAL LOW (ref 12.0–16.0)

## 2013-09-15 LAB — FERRITIN: Ferritin (ARMC): 16 ng/mL (ref 8–388)

## 2013-09-23 ENCOUNTER — Ambulatory Visit: Payer: Self-pay | Admitting: Internal Medicine

## 2013-09-29 LAB — CBC CANCER CENTER
Basophil #: 0 x10 3/mm (ref 0.0–0.1)
Basophil %: 1.2 %
Eosinophil #: 0.1 x10 3/mm (ref 0.0–0.7)
Eosinophil %: 2.4 %
HGB: 9.8 g/dL — ABNORMAL LOW (ref 12.0–16.0)
Lymphocyte #: 0.8 x10 3/mm — ABNORMAL LOW (ref 1.0–3.6)
MCHC: 33 g/dL (ref 32.0–36.0)
MCV: 85 fL (ref 80–100)
Monocyte #: 0.4 x10 3/mm (ref 0.2–0.9)
Neutrophil #: 2.8 x10 3/mm (ref 1.4–6.5)
Neutrophil %: 66.3 %
RBC: 3.5 10*6/uL — ABNORMAL LOW (ref 3.80–5.20)

## 2013-10-06 LAB — CANCER CENTER HEMOGLOBIN: HGB: 9.8 g/dL — ABNORMAL LOW (ref 12.0–16.0)

## 2013-10-13 LAB — CANCER CENTER HEMOGLOBIN: HGB: 10.8 g/dL — ABNORMAL LOW (ref 12.0–16.0)

## 2013-10-22 LAB — FERRITIN: Ferritin (ARMC): 102 ng/mL (ref 8–388)

## 2013-10-22 LAB — CANCER CENTER HEMOGLOBIN: HGB: 11.3 g/dL — ABNORMAL LOW (ref 12.0–16.0)

## 2013-10-24 ENCOUNTER — Ambulatory Visit: Payer: Self-pay | Admitting: Internal Medicine

## 2013-11-03 LAB — FERRITIN: Ferritin (ARMC): 43 ng/mL (ref 8–388)

## 2013-11-03 LAB — CANCER CENTER HEMOGLOBIN: HGB: 11.3 g/dL — ABNORMAL LOW (ref 12.0–16.0)

## 2013-11-17 LAB — CANCER CENTER HEMOGLOBIN: HGB: 11.6 g/dL — ABNORMAL LOW (ref 12.0–16.0)

## 2013-11-23 ENCOUNTER — Ambulatory Visit: Payer: Self-pay | Admitting: Internal Medicine

## 2013-12-01 LAB — CBC CANCER CENTER
Basophil #: 0 x10 3/mm (ref 0.0–0.1)
Basophil %: 0.8 %
Eosinophil #: 0.1 x10 3/mm (ref 0.0–0.7)
HCT: 35.1 % (ref 35.0–47.0)
HGB: 11.6 g/dL — ABNORMAL LOW (ref 12.0–16.0)
Lymphocyte #: 0.9 x10 3/mm — ABNORMAL LOW (ref 1.0–3.6)
Lymphocyte %: 19.9 %
MCH: 29.2 pg (ref 26.0–34.0)
MCHC: 33.2 g/dL (ref 32.0–36.0)
MCV: 88 fL (ref 80–100)
Monocyte %: 10.5 %
Neutrophil %: 66.2 %
Platelet: 218 x10 3/mm (ref 150–440)
RBC: 3.99 10*6/uL (ref 3.80–5.20)
RDW: 16.3 % — ABNORMAL HIGH (ref 11.5–14.5)
WBC: 4.3 x10 3/mm (ref 3.6–11.0)

## 2013-12-01 LAB — BASIC METABOLIC PANEL
BUN: 13 mg/dL (ref 7–18)
Co2: 31 mmol/L (ref 21–32)
Creatinine: 0.98 mg/dL (ref 0.60–1.30)
EGFR (African American): 59 — ABNORMAL LOW
EGFR (Non-African Amer.): 51 — ABNORMAL LOW
Glucose: 130 mg/dL — ABNORMAL HIGH (ref 65–99)
Osmolality: 285 (ref 275–301)

## 2013-12-01 LAB — URINALYSIS, COMPLETE
Bacteria: NEGATIVE
Blood: NEGATIVE
Ketone: NEGATIVE
Nitrite: NEGATIVE
Ph: 6.5 (ref 4.5–8.0)
Protein: 100

## 2013-12-01 LAB — LIPID PANEL
Ldl Cholesterol, Calc: 64 mg/dL (ref 0–100)
Triglycerides: 124 mg/dL (ref 0–200)

## 2013-12-01 LAB — TSH: Thyroid Stimulating Horm: 1.4 u[IU]/mL

## 2013-12-15 LAB — CANCER CENTER HEMOGLOBIN: HGB: 10.3 g/dL — ABNORMAL LOW (ref 12.0–16.0)

## 2013-12-24 ENCOUNTER — Ambulatory Visit: Payer: Self-pay | Admitting: Internal Medicine

## 2013-12-24 HISTORY — PX: UPPER GASTROINTESTINAL ENDOSCOPY: SHX188

## 2013-12-29 LAB — CANCER CENTER HEMOGLOBIN: HGB: 10.3 g/dL — ABNORMAL LOW (ref 12.0–16.0)

## 2014-01-12 LAB — CANCER CENTER HEMOGLOBIN: HGB: 10.9 g/dL — ABNORMAL LOW (ref 12.0–16.0)

## 2014-01-12 LAB — FERRITIN: Ferritin (ARMC): 64 ng/mL (ref 8–388)

## 2014-01-24 ENCOUNTER — Ambulatory Visit: Payer: Self-pay | Admitting: Internal Medicine

## 2014-01-25 ENCOUNTER — Ambulatory Visit: Payer: Self-pay | Admitting: Internal Medicine

## 2014-01-26 LAB — CANCER CENTER HEMOGLOBIN: HGB: 11.1 g/dL — AB (ref 12.0–16.0)

## 2014-02-11 LAB — CANCER CENTER HEMOGLOBIN: HGB: 11.1 g/dL — AB (ref 12.0–16.0)

## 2014-02-11 LAB — FERRITIN: Ferritin (ARMC): 17 ng/mL (ref 8–388)

## 2014-02-21 ENCOUNTER — Ambulatory Visit: Payer: Self-pay | Admitting: Internal Medicine

## 2014-03-02 LAB — CANCER CENTER HEMOGLOBIN: HGB: 10.3 g/dL — ABNORMAL LOW (ref 12.0–16.0)

## 2014-03-02 LAB — FERRITIN: FERRITIN (ARMC): 189 ng/mL (ref 8–388)

## 2014-03-16 ENCOUNTER — Ambulatory Visit: Payer: Self-pay | Admitting: Internal Medicine

## 2014-03-16 LAB — BASIC METABOLIC PANEL
ANION GAP: 5 — AB (ref 7–16)
BUN: 14 mg/dL (ref 7–18)
CALCIUM: 9.1 mg/dL (ref 8.5–10.1)
CO2: 30 mmol/L (ref 21–32)
CREATININE: 0.94 mg/dL (ref 0.60–1.30)
Chloride: 106 mmol/L (ref 98–107)
EGFR (African American): >60
EGFR (Non-African Amer.): 54 — ABNORMAL LOW
GLUCOSE: 163 mg/dL — AB (ref 65–99)
Osmolality: 285 (ref 275–301)
POTASSIUM: 3.8 mmol/L (ref 3.5–5.1)
Sodium: 141 mmol/L (ref 136–145)

## 2014-03-16 LAB — CANCER CENTER HEMOGLOBIN: HGB: 11.2 g/dL — ABNORMAL LOW (ref 12.0–16.0)

## 2014-03-24 ENCOUNTER — Ambulatory Visit: Payer: Self-pay | Admitting: Internal Medicine

## 2014-03-30 DIAGNOSIS — E7800 Pure hypercholesterolemia, unspecified: Secondary | ICD-10-CM | POA: Insufficient documentation

## 2014-03-30 DIAGNOSIS — M545 Low back pain, unspecified: Secondary | ICD-10-CM | POA: Insufficient documentation

## 2014-03-30 DIAGNOSIS — E119 Type 2 diabetes mellitus without complications: Secondary | ICD-10-CM | POA: Insufficient documentation

## 2014-03-30 DIAGNOSIS — I4891 Unspecified atrial fibrillation: Secondary | ICD-10-CM | POA: Insufficient documentation

## 2014-03-30 DIAGNOSIS — M064 Inflammatory polyarthropathy: Secondary | ICD-10-CM | POA: Insufficient documentation

## 2014-03-30 DIAGNOSIS — I10 Essential (primary) hypertension: Secondary | ICD-10-CM | POA: Insufficient documentation

## 2014-03-30 DIAGNOSIS — D649 Anemia, unspecified: Secondary | ICD-10-CM | POA: Insufficient documentation

## 2014-03-30 DIAGNOSIS — IMO0001 Reserved for inherently not codable concepts without codable children: Secondary | ICD-10-CM | POA: Insufficient documentation

## 2014-03-30 LAB — FERRITIN: FERRITIN (ARMC): 28 ng/mL (ref 8–388)

## 2014-03-30 LAB — CANCER CENTER HEMOGLOBIN: HGB: 10.9 g/dL — ABNORMAL LOW (ref 12.0–16.0)

## 2014-04-06 LAB — FERRITIN: Ferritin (ARMC): 18 ng/mL (ref 8–388)

## 2014-04-06 LAB — HEMOGLOBIN: HGB: 10.3 g/dL — ABNORMAL LOW (ref 12.0–16.0)

## 2014-04-13 LAB — HEMOGLOBIN: HGB: 10.1 g/dL — ABNORMAL LOW (ref 12.0–16.0)

## 2014-04-21 LAB — FERRITIN: Ferritin (ARMC): 166 ng/mL (ref 8–388)

## 2014-04-21 LAB — HEMOGLOBIN: HGB: 10.2 g/dL — ABNORMAL LOW (ref 12.0–16.0)

## 2014-04-23 ENCOUNTER — Ambulatory Visit: Payer: Self-pay | Admitting: Internal Medicine

## 2014-05-04 LAB — CANCER CENTER HEMOGLOBIN: HGB: 8.9 g/dL — ABNORMAL LOW (ref 12.0–16.0)

## 2014-05-04 LAB — FERRITIN: Ferritin (ARMC): 43 ng/mL (ref 8–388)

## 2014-05-04 LAB — HEMOGLOBIN: HGB: 8.6 g/dL — ABNORMAL LOW (ref 12.0–16.0)

## 2014-05-11 LAB — HEMOGLOBIN: HGB: 10.9 g/dL — ABNORMAL LOW (ref 12.0–16.0)

## 2014-05-19 LAB — FERRITIN: Ferritin (ARMC): 157 ng/mL (ref 8–388)

## 2014-05-19 LAB — HEMOGLOBIN: HGB: 10 g/dL — AB (ref 12.0–16.0)

## 2014-05-24 ENCOUNTER — Ambulatory Visit: Payer: Self-pay | Admitting: Internal Medicine

## 2014-05-25 LAB — HEMOGLOBIN: HGB: 10.2 g/dL — AB (ref 12.0–16.0)

## 2014-06-08 ENCOUNTER — Ambulatory Visit: Payer: Self-pay | Admitting: Internal Medicine

## 2014-06-08 LAB — COMPREHENSIVE METABOLIC PANEL
ANION GAP: 7 (ref 7–16)
AST: 28 U/L (ref 15–37)
Albumin: 3.2 g/dL — ABNORMAL LOW (ref 3.4–5.0)
Alkaline Phosphatase: 74 U/L
BUN: 13 mg/dL (ref 7–18)
Bilirubin,Total: 0.2 mg/dL (ref 0.2–1.0)
CALCIUM: 9 mg/dL (ref 8.5–10.1)
Chloride: 106 mmol/L (ref 98–107)
Co2: 29 mmol/L (ref 21–32)
Creatinine: 0.9 mg/dL (ref 0.60–1.30)
EGFR (African American): >60
EGFR (Non-African Amer.): 56 — ABNORMAL LOW
Glucose: 136 mg/dL — ABNORMAL HIGH (ref 65–99)
OSMOLALITY: 285 (ref 275–301)
Potassium: 3.9 mmol/L (ref 3.5–5.1)
SGPT (ALT): 34 U/L (ref 12–78)
Sodium: 142 mmol/L (ref 136–145)
TOTAL PROTEIN: 6.4 g/dL (ref 6.4–8.2)

## 2014-06-08 LAB — FERRITIN: FERRITIN (ARMC): 37 ng/mL (ref 8–388)

## 2014-06-08 LAB — HEMOGLOBIN: HGB: 10.1 g/dL — AB (ref 12.0–16.0)

## 2014-06-23 ENCOUNTER — Ambulatory Visit: Payer: Self-pay | Admitting: Internal Medicine

## 2014-06-23 ENCOUNTER — Observation Stay: Payer: Self-pay | Admitting: Internal Medicine

## 2014-06-23 LAB — COMPREHENSIVE METABOLIC PANEL
ALBUMIN: 3 g/dL — AB (ref 3.4–5.0)
ALK PHOS: 71 U/L
Anion Gap: 8 (ref 7–16)
BUN: 11 mg/dL (ref 7–18)
Bilirubin,Total: 0.2 mg/dL (ref 0.2–1.0)
CHLORIDE: 106 mmol/L (ref 98–107)
Calcium, Total: 9 mg/dL (ref 8.5–10.1)
Co2: 25 mmol/L (ref 21–32)
Creatinine: 0.82 mg/dL (ref 0.60–1.30)
EGFR (Non-African Amer.): >60
GLUCOSE: 125 mg/dL — AB (ref 65–99)
Osmolality: 278 (ref 275–301)
Potassium: 3.9 mmol/L (ref 3.5–5.1)
SGOT(AST): 33 U/L (ref 15–37)
SGPT (ALT): 30 U/L (ref 12–78)
Sodium: 139 mmol/L (ref 136–145)
Total Protein: 5.9 g/dL — ABNORMAL LOW (ref 6.4–8.2)

## 2014-06-23 LAB — CANCER CENTER HEMOGLOBIN
HGB: 8.3 g/dL — ABNORMAL LOW (ref 12.0–16.0)
HGB: 8.6 g/dL — ABNORMAL LOW (ref 12.0–16.0)

## 2014-06-23 LAB — FERRITIN: FERRITIN (ARMC): 29 ng/mL (ref 8–388)

## 2014-06-23 LAB — PROTIME-INR
INR: 1
PROTHROMBIN TIME: 12.7 s (ref 11.5–14.7)

## 2014-06-24 LAB — HEMOGLOBIN
HGB: 10.1 g/dL — AB (ref 12.0–16.0)
HGB: 10.5 g/dL — ABNORMAL LOW (ref 12.0–16.0)

## 2014-06-29 LAB — FERRITIN: Ferritin (ARMC): 329 ng/mL (ref 8–388)

## 2014-06-29 LAB — HEMOGLOBIN: HGB: 10.6 g/dL — AB (ref 12.0–16.0)

## 2014-07-06 LAB — CANCER CENTER HEMOGLOBIN: HGB: 10.5 g/dL — ABNORMAL LOW (ref 12.0–16.0)

## 2014-07-12 LAB — CANCER CENTER HEMOGLOBIN: HGB: 11.1 g/dL — ABNORMAL LOW (ref 12.0–16.0)

## 2014-07-24 ENCOUNTER — Ambulatory Visit: Payer: Self-pay | Admitting: Internal Medicine

## 2014-07-27 LAB — CANCER CENTER HEMOGLOBIN: HGB: 8.6 g/dL — ABNORMAL LOW (ref 12.0–16.0)

## 2014-07-27 LAB — FERRITIN: Ferritin (ARMC): 28 ng/mL (ref 8–388)

## 2014-07-28 LAB — CANCER CENTER HEMOGLOBIN: HGB: 8.6 g/dL — AB (ref 12.0–16.0)

## 2014-08-03 LAB — CANCER CENTER HEMOGLOBIN: HGB: 9.7 g/dL — AB (ref 12.0–16.0)

## 2014-08-06 LAB — CANCER CENTER HEMOGLOBIN: HGB: 9.7 g/dL — ABNORMAL LOW (ref 12.0–16.0)

## 2014-08-10 LAB — CANCER CENTER HEMOGLOBIN: HGB: 9.5 g/dL — ABNORMAL LOW (ref 12.0–16.0)

## 2014-08-17 LAB — CANCER CENTER HEMOGLOBIN: HGB: 9.1 g/dL — AB (ref 12.0–16.0)

## 2014-08-17 LAB — FERRITIN: Ferritin (ARMC): 137 ng/mL (ref 8–388)

## 2014-08-17 LAB — RETICULOCYTES
ABSOLUTE RETIC COUNT: 0.1641 10*6/uL (ref 0.019–0.186)
Reticulocyte: 5.34 % — ABNORMAL HIGH (ref 0.4–3.1)

## 2014-08-23 LAB — CANCER CENTER HEMOGLOBIN: HGB: 9.2 g/dL — ABNORMAL LOW (ref 12.0–16.0)

## 2014-08-24 ENCOUNTER — Ambulatory Visit: Payer: Self-pay | Admitting: Internal Medicine

## 2014-08-27 LAB — CANCER CENTER HEMOGLOBIN: HGB: 10.2 g/dL — AB (ref 12.0–16.0)

## 2014-08-31 LAB — CANCER CENTER HEMOGLOBIN: HGB: 10.3 g/dL — ABNORMAL LOW (ref 12.0–16.0)

## 2014-09-07 LAB — FERRITIN: Ferritin (ARMC): 136 ng/mL (ref 8–388)

## 2014-09-07 LAB — CANCER CENTER HEMOGLOBIN: HGB: 9.4 g/dL — ABNORMAL LOW (ref 12.0–16.0)

## 2014-09-17 ENCOUNTER — Ambulatory Visit: Payer: Self-pay | Admitting: Internal Medicine

## 2014-09-17 LAB — BASIC METABOLIC PANEL
Anion Gap: 7 (ref 7–16)
BUN: 12 mg/dL (ref 7–18)
Calcium, Total: 9.3 mg/dL (ref 8.5–10.1)
Chloride: 103 mmol/L (ref 98–107)
Co2: 30 mmol/L (ref 21–32)
Creatinine: 1.1 mg/dL (ref 0.60–1.30)
EGFR (African American): >60
EGFR (Non-African Amer.): 50 — ABNORMAL LOW
Glucose: 123 mg/dL — ABNORMAL HIGH (ref 65–99)
OSMOLALITY: 281 (ref 275–301)
Potassium: 3.6 mmol/L (ref 3.5–5.1)
SODIUM: 140 mmol/L (ref 136–145)

## 2014-09-17 LAB — CANCER CENTER HEMOGLOBIN: HGB: 8.9 g/dL — AB (ref 12.0–16.0)

## 2014-09-17 LAB — HEMOGLOBIN A1C

## 2014-09-21 LAB — FERRITIN: Ferritin (ARMC): 30 ng/mL (ref 8–388)

## 2014-09-21 LAB — CANCER CENTER HEMOGLOBIN: HGB: 7.7 g/dL — AB (ref 12.0–16.0)

## 2014-09-23 ENCOUNTER — Ambulatory Visit: Payer: Self-pay | Admitting: Internal Medicine

## 2014-09-23 LAB — CANCER CENTER HEMOGLOBIN: HGB: 8.6 g/dL — ABNORMAL LOW (ref 12.0–16.0)

## 2014-09-28 LAB — FERRITIN: Ferritin (ARMC): 386 ng/mL (ref 8–388)

## 2014-09-28 LAB — CANCER CENTER HEMOGLOBIN: HGB: 10.6 g/dL — ABNORMAL LOW (ref 12.0–16.0)

## 2014-09-30 ENCOUNTER — Ambulatory Visit: Payer: Self-pay | Admitting: Gastroenterology

## 2014-10-05 LAB — CANCER CENTER HEMOGLOBIN: HGB: 11.2 g/dL — AB (ref 12.0–16.0)

## 2014-10-12 LAB — CANCER CENTER HEMOGLOBIN: HGB: 10.9 g/dL — ABNORMAL LOW (ref 12.0–16.0)

## 2014-10-19 LAB — CANCER CENTER HEMOGLOBIN: HGB: 10.9 g/dL — ABNORMAL LOW (ref 12.0–16.0)

## 2014-10-24 ENCOUNTER — Ambulatory Visit: Payer: Self-pay | Admitting: Internal Medicine

## 2014-10-26 LAB — FERRITIN: Ferritin (ARMC): 59 ng/mL (ref 8–388)

## 2014-10-26 LAB — CANCER CENTER HEMOGLOBIN: HGB: 11.2 g/dL — ABNORMAL LOW (ref 12.0–16.0)

## 2014-11-04 LAB — CANCER CENTER HEMOGLOBIN: HGB: 10.2 g/dL — ABNORMAL LOW (ref 12.0–16.0)

## 2014-11-16 LAB — FERRITIN: Ferritin (ARMC): 21 ng/mL (ref 8–388)

## 2014-11-16 LAB — CANCER CENTER HEMOGLOBIN: HGB: 9.7 g/dL — AB (ref 12.0–16.0)

## 2014-11-23 ENCOUNTER — Ambulatory Visit: Payer: Self-pay | Admitting: Internal Medicine

## 2014-11-23 LAB — CANCER CENTER HEMOGLOBIN: HGB: 9.8 g/dL — ABNORMAL LOW (ref 12.0–16.0)

## 2014-11-30 LAB — FERRITIN: Ferritin (ARMC): 151 ng/mL (ref 8–388)

## 2014-11-30 LAB — CANCER CENTER HEMOGLOBIN: HGB: 10 g/dL — AB (ref 12.0–16.0)

## 2014-12-07 LAB — CANCER CENTER HEMOGLOBIN: HGB: 9.2 g/dL — ABNORMAL LOW (ref 12.0–16.0)

## 2014-12-07 LAB — FERRITIN: Ferritin (ARMC): 64 ng/mL (ref 8–388)

## 2014-12-09 LAB — CANCER CENTER HEMOGLOBIN: HGB: 9.1 g/dL — ABNORMAL LOW (ref 12.0–16.0)

## 2014-12-15 LAB — CANCER CENTER HEMOGLOBIN: HGB: 8.5 g/dL — AB (ref 12.0–16.0)

## 2014-12-21 LAB — CANCER CENTER HEMOGLOBIN: HGB: 10.6 g/dL — AB (ref 12.0–16.0)

## 2014-12-24 ENCOUNTER — Ambulatory Visit
Admit: 2014-12-24 | Disposition: A | Discharge status: Home / Self Care | Payer: Self-pay | Attending: Internal Medicine | Admitting: Internal Medicine

## 2014-12-24 ENCOUNTER — Ambulatory Visit: Payer: Self-pay | Admitting: Internal Medicine

## 2014-12-28 LAB — FERRITIN: FERRITIN (ARMC): 208 ng/mL (ref 8–388)

## 2014-12-28 LAB — CANCER CENTER HEMOGLOBIN: HGB: 11.1 g/dL — AB (ref 12.0–16.0)

## 2015-01-04 LAB — CANCER CENTER HEMOGLOBIN: HGB: 11 g/dL — ABNORMAL LOW (ref 12.0–16.0)

## 2015-01-11 LAB — CANCER CENTER HEMOGLOBIN: HGB: 10.5 g/dL — AB (ref 12.0–16.0)

## 2015-01-11 LAB — FERRITIN: Ferritin (ARMC): 65 ng/mL (ref 8–388)

## 2015-01-18 LAB — FERRITIN: FERRITIN (ARMC): 44 ng/mL (ref 8–388)

## 2015-01-18 LAB — CANCER CENTER HEMOGLOBIN: HGB: 11.6 g/dL — ABNORMAL LOW (ref 12.0–16.0)

## 2015-01-24 ENCOUNTER — Ambulatory Visit
Admit: 2015-01-24 | Disposition: A | Discharge status: Home / Self Care | Payer: Self-pay | Attending: Internal Medicine | Admitting: Internal Medicine

## 2015-01-24 ENCOUNTER — Ambulatory Visit: Payer: Self-pay | Admitting: Internal Medicine

## 2015-01-26 LAB — CANCER CENTER HEMOGLOBIN: HGB: 10.5 g/dL — AB (ref 12.0–16.0)

## 2015-01-26 LAB — FERRITIN: Ferritin (ARMC): 28 ng/mL (ref 8–388)

## 2015-02-22 ENCOUNTER — Ambulatory Visit
Admit: 2015-02-22 | Disposition: A | Discharge status: Home / Self Care | Payer: Self-pay | Attending: Internal Medicine | Admitting: Internal Medicine

## 2015-03-23 LAB — CANCER CENTER HEMOGLOBIN: HGB: 11.1 g/dL — AB (ref 12.0–16.0)

## 2015-03-23 LAB — FERRITIN: FERRITIN (ARMC): 156 ng/mL

## 2015-03-25 ENCOUNTER — Ambulatory Visit: Payer: Self-pay | Admitting: Internal Medicine

## 2015-03-25 ENCOUNTER — Ambulatory Visit
Admit: 2015-03-25 | Disposition: A | Discharge status: Home / Self Care | Payer: Self-pay | Attending: Internal Medicine | Admitting: Internal Medicine

## 2015-04-01 LAB — CANCER CENTER HEMOGLOBIN: HGB: 11 g/dL — ABNORMAL LOW (ref 12.0–16.0)

## 2015-04-05 LAB — CANCER CENTER HEMOGLOBIN: HGB: 9.2 g/dL — ABNORMAL LOW (ref 12.0–16.0)

## 2015-04-05 LAB — FERRITIN: Ferritin (ARMC): 49 ng/mL

## 2015-04-05 NOTE — Progress Notes (Signed)
This encounter was created in error - please disregard.

## 2015-04-07 LAB — CANCER CENTER HEMOGLOBIN: HGB: 9.1 g/dL — ABNORMAL LOW (ref 12.0–16.0)

## 2015-04-12 LAB — FERRITIN: Ferritin (ARMC): 399 ng/mL — ABNORMAL HIGH

## 2015-04-12 LAB — CANCER CENTER HEMOGLOBIN: HGB: 9.6 g/dL — ABNORMAL LOW (ref 12.0–16.0)

## 2015-04-16 NOTE — H&P (Signed)
PATIENT NAME:  Tanya Townsend, Tanya Townsend MR#:  [id number] DATE OF BIRTH:  [birth year]  DATE OF ADMISSION:  06/23/2014  HISTORY OF PRESENT ILLNESS:  This is a 81 year old patient known to me and she is brought in for phase 3 for a transfusion for subacute and chronic GI bleeding with more rapid hemoglobin decline, hemoglobin down to 8.6 requiring transfusion. The patient has dropped her hemoglobin 1.5 grams since the last determination indicating more rapid GI blood loss and risk of decompensation in an elderly patient with underlying, but currently compensated  heart disease who gets confusion, has had some stumbling and falls in the past due to weakness.  She was first seen in the clinic and then on the floor. Does not have any current headache or dizziness or orthostasis or chills or sweats or retrosternal chest pain. No palpitations. No ear or jaw pain. No abdominal pain. No nausea, vomiting, diarrhea. She denies dark stools, but her memory is poor and she is usually not taking note of her bowel movements.  She has no extremity edema. No focal weakness.   PAST MEDICAL HISTORY: Includes atrial fibrillation, diabetes, AV malformation of duodenum, coronary artery disease, MI, hypercholesteremia, osteoporosis, and esophageal stricture, some dementia, cognitive impairments.   SOCIAL HISTORY: Prior alcohol user. No recent alcohol. Not a smoker.   ALLERGIES: PENICILLIN, SULFA CAUSE ANAPHYLAXIS. ACE INHIBITORS HAVE CAUSED COUGH.   MEDICATIONS: Donepezil 10 mg p.o. at night, metoprolol extended release 100 mg p.o. every day, simvastatin 80 mg p.o. daily, Norvasc 5 mg p.o. every evening, PreserVision 1 twice a day, Caltrate once at night, Centrum Silver daily, and pantoprazole 40 mg daily.   PHYSICAL EXAMINATION:  VITAL SIGNS: Stable in the clinic and on arrival to the floor, patient is alert.  SKIN:  Has pallor. No jaundice.   HEENT:  Sclerae clear.  MOUTH: No thrush.  LYMPHATIC: No palpable lymph nodes in the neck,  supraclavicular, submandibular, or axilla.   LUNGS: Clear with decreased air entry.  ABDOMEN: Abdomen was not tender. No palpable mass or organomegaly.  EXTREMITIES: Trace ankle and pretibial edema. No calf tenderness. No bruising.  NEUROLOGIC: Grossly nonfocal. Memory and cognitive lapses are present and at baseline.   LABORATORY DATA:  Ferritin is 29 down from recent, sodium 139, potassium 3.9. The creatinine is pending.  A pro time was 1.0.   IMPRESSION AND PLAN:  Patient who has had intravenous ferriheme p.r.n. for ferritins less than 100 in the clinic and has had blood transfusions p.r.n. for symptomatic anemia or hemoglobins less than 9 with a prior history of bleeding and with general weakness, confusion, to prevent acute decompensation in a patient with known underlying heart disease; although, currently, heart disease is compensated. Recently, needed blood 1 month ago. Hemoglobin had held, but now is up 1.5 grams from the recent determination. The iron is also down after just receiving ferriheme only 2 weeks ago. Therefore, patient clearly has subacute on chronic increasing GI blood losses.   Transfuse 1 unit of packed red blood cells with Tylenol and Benadryl. Tomorrow, give intravenous ferriheme. Watch the vital signs. Keep n.p.o. overnight but then restore diet. Recheck the hemoglobin in the morning and make sure the hemoglobin jumps at the expected level. Watch the stools. If active bleeding, might have to keep the patient longer. No role for GI consultation currently for a patient who has previous known arteriovenous malformations and has not wanted other aggressive interventions or invasive procedures. Also discussed with family members earlier in the clinic.  The patient has had transfusion before. Understands the risks potentially versus the benefits.  Will also consent prior to today's blood.     ____________________________ Simonne Come Inez Pilgrim, MD rgg:dd/am D: 06/23/2014 19:22:00  ET T: 06/23/2014 21:29:03 ET JOB#: [id number]  cc: Simonne Come. Inez Pilgrim, MD, <Dictator> Dallas Schimke MD ELECTRONICALLY SIGNED 07/08/2014 13:11

## 2015-04-17 NOTE — Consult Note (Signed)
PATIENT NAME:  Tanya Townsend, Tanya Townsend MR#:  [id number] DATE OF BIRTH:  Jan 06, 1922  DATE OF CONSULTATION:  02/22/2012  REFERRING PHYSICIAN:  Alounthith Phichith, MD CONSULTING PHYSICIAN:  Jill Side, MD PRIMARY CARE PHYSICIAN:  Frazier Richards, MD  REASON FOR CONSULTATION: Severe anemia.   HISTORY OF PRESENT ILLNESS: An 81 year old female with history of hypertension, coronary artery disease, hyperlipidemia, diabetes. The patient has history of anemia in the past. She underwent an upper GI endoscopy a little over a year ago, and a bleeding AVM was found in the duodenum. That AVM was cauterized and according to the daughter, the patient has done well since then. The patient also had a colonoscopy in 2007, and multiple polyps were removed. The patient was recently noticed to be pale, more lethargic, and less active. Her hemoglobin a couple of days ago was noted to be 11.3, although yesterday the hemoglobin was only 8.9; and patient was admitted for further evaluation of suspected gastrointestinal blood loss. The patient has received 1 unit of packed RBCs. Hemoglobin is now 9.3. The patient denies any active bleeding such as melena, bright red blood per rectum, nausea, vomiting. She is overall somewhat lethargic, appears pale, and feels very tired. No other significant GI symptoms were reported.   PAST MEDICAL HISTORY:  1. History of chronic anemia secondary to AV malformations as mentioned above.  2. History of chronic polyp.   3. History of atrial fibrillation, diabetes, coronary artery disease, status post myocardial infarction.  History of gout.  4. Hyperlipidemia.  5. Osteoporosis.  6. Osteoarthritis.  7. Pulmonary nodules.   8. Chronic lymphopenia.   9. History of esophageal stricture, status post dilation in the past.   10. Hypertension.   PAST SURGICAL HISTORY:  1. Thyroidectomy.  2. Tonsillectomy.  3. Appendectomy.   ALLERGIES: Penicillin, sulfa, and ACE inhibitors.   MEDICINES AT  HOME: Cipro, metoprolol, simvastatin, Caltrate, Centrum, aspirin, Niferex, colchicine, and Protonix.    SOCIAL HISTORY: She lives alone. She drinks wine. She does not smoke.   FAMILY HISTORY: Otherwise unremarkable.   REVIEW OF SYSTEMS: Negative except for what is mentioned in the History of Present Illness.   PHYSICAL EXAMINATION:  GENERAL: Somewhat obese female, appears somewhat lethargic, does not appear to be in any acute distress.   VITAL SIGNS: Temperature 99.5, pulse 79, respirations 18, blood pressure 121/69.   SKIN: His skin is very pale.   NECK: Veins are flat.   LUNGS: Clear to auscultation bilaterally with fair air entry.   CARDIOVASCULAR: Regular rate and rhythm.   ABDOMEN: Quite soft and benign. Bowel sounds positive. No hepatosplenomegaly.    NEUROLOGICAL EXAMINATION: Appears to be unremarkable.   LABORATORIES: Troponin is less than 0.02. TSH 0.76. Hemoglobin is 9.3 after 1 unit of blood transfusion. INR is normal at 1.1. Ultrasound of the abdomen is unremarkable.   ASSESSMENT AND PLAN: Patient with history of chronic anemia, is now presenting with further drop in her hemoglobin and hematocrit. The patient has history of bleeding AVM in the duodenum, and most likely her anemia is secondary to small bowel AVMs. There are no signs of active GI bleeding, and she is hemodynamically stable but her hemoglobin and hematocrit have dropped significantly.   I would recommend repeating another upper GI endoscopy tomorrow for further evaluation. The patient may need a colonoscopy as well. The procedure of upper GI endoscopy was discussed with the patient and her daughter, and they are in full agreement. Further recommendations to follow.     ____________________________  Jill Side, MD si:vtd D: 02/22/2012 17:20:22 ET T: 02/22/2012 19:33:29 ET JOB#: [id number]  cc: Jill Side, MD, <Dictator> Jill Side MD ELECTRONICALLY SIGNED 02/23/2012 15:00

## 2015-04-17 NOTE — Consult Note (Signed)
Chief Complaint:   Subjective/Chief Complaint No bowel movements and no signs of active bleeding.   VITAL SIGNS/ANCILLARY NOTES: **Vital Signs.:   03-Mar-13 06:20   Vital Signs Type Recheck   Temperature Temperature (F) 101.3   Celsius 38.5   Temperature Source oral; RN notified.   Routine Hem:  03-Mar-13 05:32    WBC (CBC) 2.7   RBC (CBC) 2.85   Hemoglobin (CBC) 8.6   Hematocrit (CBC) 26.3   Platelet Count (CBC) 177   MCV 92   MCH 30.3   MCHC 32.9   RDW 19.5  Routine Chem:  03-Mar-13 05:32    Glucose, Serum 129   BUN 11   Creatinine (comp) 0.81   Sodium, Serum 140   Potassium, Serum 4.0   Chloride, Serum 108   CO2, Serum 23   Calcium (Total), Serum 7.9  Hepatic:  03-Mar-13 05:32    Bilirubin, Total 0.4   Alkaline Phosphatase 92   SGPT (ALT) 124   SGOT (AST) 134   Total Protein, Serum 4.8   Albumin, Serum 2.3  Routine Chem:  03-Mar-13 05:32    Osmolality (calc) 280   eGFR (African American) >60   eGFR (Non-African American) >60   Anion Gap 9  Routine Hem:  03-Mar-13 05:32    Neutrophil % 79.8   Lymphocyte % 11.2   Monocyte % 8.9   Eosinophil % 0.0   Basophil % 0.1   Neutrophil # 2.1   Lymphocyte # 0.3   Monocyte # 0.2   Eosinophil # 0.0   Basophil # 0.0  Blood Glucose:  03-Mar-13 07:44    POCT Blood Glucose 135   Radiology Results: [place]:    01-Mar-13 01:24, US Abdomen Limited Survey   US Abdomen Limited Survey    REASON FOR EXAM:    RUQ pain  COMMENTS:   Body Site: GB and Fossa, CBD, Head of Pancreas    PROCEDURE: [place]  - US ABDOMEN LIMITED SURVEY  - Feb 22 2012  1:24AM     RESULT: Ultrasound of the abdomen performed emergently is limited to the   right upper quadrant. The visualized pancreas is normal. The included   portions of the liver appear unremarkable. No gallstones are evident. The   gallbladder wall thickness measures 2.0 mm. The common bile duct diameter   is 4.6 to 4.9 mm. The visualized right kidney is normal in appearance.   There  is no ascites or abnormal fluid collection.    IMPRESSION:     Unremarkable limited right upper quadrant abdominal sonogram.  Thank you for the opportunity to contribute to the care of your patient.           Verified By: Sundra Aland, M.D., MD   Assessment/Plan:  Assessment/Plan:   Assessment Anemia due to GI AVM's. No signs of active bleeding and H and H seems to be stable. Fever.    Plan Continue PPI and Carafate. Follow H and H. Will follow.   Electronic Signatures: Jill Side (MD)  (Signed 03-Mar-13 11:38)  Authored: Chief Complaint, VITAL SIGNS/ANCILLARY NOTES, Lab Results, Radiology Results, Assessment/Plan   Last Updated: 03-Mar-13 11:38 by Jill Side (MD)

## 2015-04-17 NOTE — Consult Note (Signed)
Chief Complaint:   Subjective/Chief Complaint Overall better. One BM today which was reported normal. Hemoglobin is slowly trending down although no signs of active bleeding and it may be due to dilution after transfusion as well as an underlying hematologic disorder. Agree with current management. Follow H and H and transfuse if hemoglobin falls below 8.0 Will follow.   Electronic Signatures: Jill Side (MD)  (Signed [phone] 20:14)  Authored: Chief Complaint   Last Updated: 04-Mar-13 20:14 by Jill Side (MD)

## 2015-04-17 NOTE — H&P (Signed)
PATIENT NAME:  Tanya Townsend, Tanya Townsend MR#:  [id number] DATE OF BIRTH:  [birth year]  DATE OF ADMISSION:  02/22/2012  REFERRING PHYSICIAN: Pollie Friar, MD  PRIMARY CARE PHYSICIAN: Frazier Richards, MD  PRESENTING COMPLAINT: Altered mental status, increased lethargy, and decreased energy.   HISTORY OF PRESENT ILLNESS: Ms. Nelson is a pleasant 81 year old woman with history of coronary artery disease, hypertension, diabetes, hyperlipidemia, and history of AV malformations with iron deficiency anemia requiring multiple iron and blood transfusions in the past followed by hematology/oncology who presents with her daughter. The patient herself denies any complaints. Her daughter is present in the room and provides all of her history. At baseline it sounds like she has some underlying mild dementia; however, her daughter reports that since Sunday she has been having increased lethargy and fatigue to the point where she was not interested in going to church. As the week has progressed, she has had worsening symptoms. She has been off balance, she has had poor p.o. intake and worsening with confusion from her baseline. Her daughter reports recently noticing increased pallor. She has had incontinence which is not normal for her. She has had loss of interest in her usual activities. The patient herself denies any chest pain symptoms or shortness of breath, but her daughter says that she noticed that she was working to catch her breath at times. In the past, these symptoms have been related to when she has had acute blood loss and drop in her hemoglobin. She brought her mother to see Dr. Inez Pilgrim on 02/18/2012. Hemoglobin was drawn and it was noted that her hemoglobin was 11.3, but urine seemed mildly dirty and was empirically treated with ciprofloxacin; however, the patient represents with worsening symptoms and repeat hemoglobin now is 8.9 and her urine culture from 02/18/2012 was actually negative to date.   PAST MEDICAL  HISTORY:  1. Iron deficiency anemia with history of AV malformations of the duodenum and history of cauterization of the duodenal AVM, done by Dr. Candace Cruise. 2. Atrial fibrillation.  3. Diabetes.  4. Coronary artery disease and myocardial infarction.  5. Gout.  6. Hyperlipidemia.  7. Osteoporosis.  8. Osteoarthritis.  9. Pulmonary nodules.  10. Anxiety.  11. Lymphopenia.  12. Esophageal stricture status post dilation.  13. Hypertension.   PAST SURGICAL HISTORY:  1. Atherectomy. 2. Partial thyroidectomy. 3. Tonsillectomy. 4. Appendectomy.   ALLERGIES: Penicillin, sulfa, and ACE inhibitors.  MEDICATIONS: 1. Cipro 500 mg twice a day, started on 02/18/2012.  2. Metoprolol XL 100 mg daily.  3. Simvastatin 80 mg daily.  4. Beconase AQ spray 1 puff nasal twice a day. 5. Caltrate.  6. Centrum Silver.  7. Aspirin 81 mg daily.  8. Niferex 150 mg.  9. Colchicine 0.6 mg three times daily as needed.  10. Protonix 40 mg daily.   SOCIAL HISTORY: She lives in [place] alone, but her daughter lives nearby and she is her primary caretaker. She quit tobacco in her 98s. She drinks rum and wine, unknown amount, but her daughter reports that it is "significant". No drugs.   FAMILY HISTORY: Brother with history of aortic aneurysm. She has other brothers with prostate cancer. Mother had coronary artery disease.  REVIEW OF SYSTEMS: Again, the patient herself denies any symptoms. Review of systems was basically obtained via her daughter. CONSTITUTIONAL: No fevers or chills. EYES: No changes in vision. ENT: No epistaxis or discharge. RESPIRATORY: Her daughter reports noticing some shortness of breath. CARDIOVASCULAR: No chest pain, palpitations, or syncope. GI: No nausea, vomiting,  or diarrhea. Unsure if she has had bloody stool or dark stools.  GU: Denies any dysuria or hematuria. Her daughter reports incontinence, which is not normal for her. ENDOCRINE: No polyuria or polydipsia. HEME: She has easy bruising.  SKIN: No ulcers.  MUSCULOSKELETAL: Denies any pain. NEUROLOGIC: No history of strokes or seizures. PSYCH: She denies any depression.   PHYSICAL EXAMINATION:   VITAL SIGNS: Temperature 98.7, pulse 85, respiratory rate 20, blood pressure 110/56, and saturation 100% on room air.   GENERAL: Lying in bed, in no apparent distress.   HEENT: Normocephalic, atraumatic. Pupils equal, symmetric, and anicteric. She has pale conjunctivae. Nares without discharge. Slightly dry mucous membranes.   NECK: Soft and supple. No adenopathy or JVP.   HEART: Non-tachy. No murmurs, rubs, or gallops.   LUNGS: Clear to auscultation bilaterally. No use of accessory muscles or increased respiratory effort.   ABDOMEN: Soft. Positive bowel sounds. No mass appreciated.   EXTREMITIES: Trace edema bilaterally. Dorsal pedis pulses intact.   MUSCULOSKELETAL: No joint effusion.   SKIN: No ulcers.   NEUROLOGIC: Symmetrical strength. No focal deficits.   PSYCH: She is alert and answering questions appropriately.   PERTINENT LABS AND STUDIES: INR 1.1. PTT 31.1.   Chest, PA and lateral, with in no acute findings. There is borderline cardiomegaly.   WBC 2, hemoglobin 8.9, hematocrit 26.9, platelets 130, MCV 92, neutrophil 1.5, lymphocyte 0.3. Glucose 143, BUN 20, creatinine 0.99, sodium 134, potassium 3.9, chloride 98, carbon dioxide 27, calcium 9.4, total bilirubin 0.3, alkaline phosphatase 81, ALT 17, AST 164, total protein 6.1, albumin 2.7. Troponin less than 0.02.   Urinalysis from 02/18/2012 with specific gravity of greater than 1.03, 2+ blood, pH 6, protein 30 mg/dL, negative nitrite, 2+ leukocyte esterase, RBC 5 to 15, WBC 5 to 15, and 2+ bacteria.   Urine culture was negative to date.  Hemoglobin from 02/18/2012 was 11.3.   ASSESSMENT AND PLAN: Ms. Eble is a pleasant 81 year old woman with history of AVMs of the duodenum with iron deficiency anemia and history of multiple iron and blood transfusions prior  to her cauterization a year ago, history of hypertension, coronary artery disease, hyperlipidemia, diabetes, atrial fibrillation, alcohol use, and likely underlying dementia presenting with altered mental status, lethargy, and poor p.o. intake.  1. Acute on chronic anemia, symptomatic, likely in the setting of her AVMs, status post 1 unit transfusion in the ED. She is hemodynamically stable. Continue to cycle hemoglobin. Pancytopenia could be in the setting of profuse bleed. The patient has history of lymphopenia, but does have history of abnormal platelets levels. We will continue to follow, get a gastroenterology consultation, continue with PPI twice a day, and continue IV fluids. We will discontinue her Cipro as her urine culture is negative. She does have history of partial thyroidectomy. We will send a TSH.  2. Altered mental status, as above, likely with some underlying dementia. Her daughter also reports chronic alcohol use, which they have been diluting her drinks for the past one month. We will start on CIWA protocol for now.  3. Transaminitis, likely with some dehydration versus alcohol use. Continue IV fluids. Continue to follow. Get CK and MB. Hold simvastatin.  4. Diabetes. Sliding scale insulin. She is not on any medication.  5. Coronary artery disease. As above, cycle her cardiac enzymes. Low suspicion for acute coronary syndrome. We will hold her aspirin for now.  6. Prophylaxis with TED hose, SCDs, and Protonix.  TIME SPENT: Approximately 50 minutes was spent on  patient care.  ____________________________ Rita Ohara, MD ap:slb D: 02/22/2012 07:35:07 ET T: 02/22/2012 08:56:21 ET JOB#: [id number]  cc: Brien Few Martrice Apt, MD, <Dictator> Ocie Cornfield. Ouida Sills, MD Rita Ohara MD ELECTRONICALLY SIGNED 02/24/2012 5:07

## 2015-04-17 NOTE — Discharge Summary (Signed)
PATIENT NAME:  Tanya Townsend, Tanya Townsend MR#:  [id number] DATE OF BIRTH:  [birth year]  DATE OF ADMISSION:  02/22/2012 DATE OF DISCHARGE:  02/27/2012  DISCHARGE DIAGNOSES:  1. Encephalopathy. 2. Hepatitis, likely alcohol related.  3. Pneumonia, likely present on admission but not cleared clinically for several days.  DISCHARGE MEDICATIONS:  1. Prednisone taper 60 mg tapering by 20 mg every three days until gone. 2. Levaquin 500 mg daily for three days.  3. Usual home medications.  HISTORY AND PHYSICAL: Please see detailed history and physical done on admission.   HOSPITAL COURSE: The patient was admitted confused, febrile. She became more short of breath over a day or two. Pneumonia was then diagnosed and improved with nebulizers, steroids, and antibiotics. During the course of hospitalization she slowly improved, as far as her mental status. She was somewhat dehydrated on admission. He was rehydrated with IV fluids which seemed to help out the situation also. It was noted that she had been drinking a good deal lately. She had elevated liver enzymes and low albumin. This was discussed. She understands not to drink any further alcohol or take Tylenol. She had some hyperglycemia on the steroids. She had been noted to have a low grade type 2 diabetes with virtually normal A1c on diet previously. Hopefully this will come back to status off the steroids. She was still weak and not ambulating well. Home health will see her as well as physical therapy and nursing to judge her response to treatment as noted, etc.   TIME SPENT: It took approximately 35 minutes to do discharge tasks today.  ____________________________ Ocie Cornfield. Ouida Sills, MD mwa:slb D: 02/27/2012 08:02:29 ET     T: 02/27/2012 16:34:03 ET       JOB#: [id number] Kirk Ruths MD ELECTRONICALLY SIGNED 02/28/2012 7:54

## 2015-04-17 NOTE — Consult Note (Signed)
Chief Complaint:   Subjective/Chief Complaint EGD done. Multiple gastric AVM's, few with some blood in the vicinity. Treated with heater probe.   Recommendations: Clear liquid diet. Carafate 1 gm QID in addition to PPI. Follow H and H. Will follow.   Electronic Signatures: Jill Side (MD)  (Signed 02-Mar-13 12:06)  Authored: Chief Complaint   Last Updated: 02-Mar-13 12:06 by Jill Side (MD)

## 2015-04-19 ENCOUNTER — Ambulatory Visit
Admit: 2015-04-19 | Disposition: A | Discharge status: Home / Self Care | Payer: Self-pay | Attending: Internal Medicine | Admitting: Internal Medicine

## 2015-04-19 LAB — COMPREHENSIVE METABOLIC PANEL
AST: 30 U/L
Albumin: 3.5 g/dL
Alkaline Phosphatase: 71 U/L
Anion Gap: 9 (ref 7–16)
BILIRUBIN TOTAL: 0.4 mg/dL
BUN: 17 mg/dL
Calcium, Total: 9.3 mg/dL
Chloride: 102 mmol/L
Co2: 29 mmol/L
Creatinine: 0.83 mg/dL
Glucose: 118 mg/dL — ABNORMAL HIGH
Potassium: 3.6 mmol/L
SGPT (ALT): 26 U/L
Sodium: 140 mmol/L
Total Protein: 6.3 g/dL — ABNORMAL LOW

## 2015-04-19 LAB — LIPID PANEL
Cholesterol: 162 mg/dL
HDL Cholesterol: 54 mg/dL
Ldl Cholesterol, Calc: 81 mg/dL
TRIGLYCERIDES: 136 mg/dL
VLDL Cholesterol, Calc: 27 mg/dL

## 2015-04-19 LAB — CANCER CENTER HEMOGLOBIN: HGB: 10.7 g/dL — ABNORMAL LOW (ref 12.0–16.0)

## 2015-04-19 LAB — FERRITIN: Ferritin (ARMC): 219 ng/mL

## 2015-04-21 ENCOUNTER — Other Ambulatory Visit: Payer: Self-pay | Admitting: *Deleted

## 2015-04-21 DIAGNOSIS — D638 Anemia in other chronic diseases classified elsewhere: Secondary | ICD-10-CM | POA: Insufficient documentation

## 2015-04-22 ENCOUNTER — Other Ambulatory Visit: Payer: Self-pay | Admitting: *Deleted

## 2015-04-22 ENCOUNTER — Encounter: Payer: Self-pay | Admitting: *Deleted

## 2015-04-24 DIAGNOSIS — M810 Age-related osteoporosis without current pathological fracture: Secondary | ICD-10-CM | POA: Insufficient documentation

## 2015-04-24 DIAGNOSIS — F419 Anxiety disorder, unspecified: Secondary | ICD-10-CM | POA: Insufficient documentation

## 2015-04-24 DIAGNOSIS — D509 Iron deficiency anemia, unspecified: Secondary | ICD-10-CM | POA: Insufficient documentation

## 2015-04-24 DIAGNOSIS — I2510 Atherosclerotic heart disease of native coronary artery without angina pectoris: Secondary | ICD-10-CM | POA: Insufficient documentation

## 2015-04-24 DIAGNOSIS — IMO0001 Reserved for inherently not codable concepts without codable children: Secondary | ICD-10-CM | POA: Insufficient documentation

## 2015-04-24 DIAGNOSIS — R809 Proteinuria, unspecified: Secondary | ICD-10-CM | POA: Insufficient documentation

## 2015-04-24 DIAGNOSIS — M1990 Unspecified osteoarthritis, unspecified site: Secondary | ICD-10-CM | POA: Insufficient documentation

## 2015-04-24 DIAGNOSIS — K222 Esophageal obstruction: Secondary | ICD-10-CM | POA: Insufficient documentation

## 2015-04-24 DIAGNOSIS — J309 Allergic rhinitis, unspecified: Secondary | ICD-10-CM | POA: Insufficient documentation

## 2015-04-26 ENCOUNTER — Inpatient Hospital Stay: Payer: Medicare Other | Admitting: Internal Medicine

## 2015-04-26 ENCOUNTER — Inpatient Hospital Stay: Payer: Medicare Other

## 2015-04-26 ENCOUNTER — Inpatient Hospital Stay: Payer: Medicare Other | Attending: Internal Medicine

## 2015-04-26 VITALS — BP 135/59 | HR 55 | Temp 96.4°F

## 2015-04-26 DIAGNOSIS — M818 Other osteoporosis without current pathological fracture: Secondary | ICD-10-CM | POA: Insufficient documentation

## 2015-04-26 DIAGNOSIS — R970 Elevated carcinoembryonic antigen [CEA]: Secondary | ICD-10-CM | POA: Diagnosis not present

## 2015-04-26 DIAGNOSIS — E119 Type 2 diabetes mellitus without complications: Secondary | ICD-10-CM | POA: Insufficient documentation

## 2015-04-26 DIAGNOSIS — E780 Pure hypercholesterolemia: Secondary | ICD-10-CM | POA: Diagnosis not present

## 2015-04-26 DIAGNOSIS — Z79899 Other long term (current) drug therapy: Secondary | ICD-10-CM | POA: Diagnosis not present

## 2015-04-26 DIAGNOSIS — D638 Anemia in other chronic diseases classified elsewhere: Secondary | ICD-10-CM

## 2015-04-26 DIAGNOSIS — D6489 Other specified anemias: Secondary | ICD-10-CM

## 2015-04-26 DIAGNOSIS — I252 Old myocardial infarction: Secondary | ICD-10-CM | POA: Diagnosis not present

## 2015-04-26 DIAGNOSIS — D649 Anemia, unspecified: Secondary | ICD-10-CM

## 2015-04-26 DIAGNOSIS — D509 Iron deficiency anemia, unspecified: Secondary | ICD-10-CM | POA: Diagnosis present

## 2015-04-26 DIAGNOSIS — I2510 Atherosclerotic heart disease of native coronary artery without angina pectoris: Secondary | ICD-10-CM | POA: Insufficient documentation

## 2015-04-26 DIAGNOSIS — R7989 Other specified abnormal findings of blood chemistry: Secondary | ICD-10-CM | POA: Diagnosis not present

## 2015-04-26 LAB — CBC
HEMATOCRIT: 32.1 % — AB (ref 35.0–47.0)
HEMOGLOBIN: 10.2 g/dL — AB (ref 12.0–16.0)
MCH: 28.5 pg (ref 26.0–34.0)
MCHC: 31.7 g/dL — AB (ref 32.0–36.0)
MCV: 90 fL (ref 80.0–100.0)
Platelets: 203 10*3/uL (ref 150–440)
RBC: 3.57 MIL/uL — AB (ref 3.80–5.20)
RDW: 18.2 % — ABNORMAL HIGH (ref 11.5–14.5)
WBC: 3.4 10*3/uL — ABNORMAL LOW (ref 3.6–11.0)

## 2015-04-26 LAB — FERRITIN: Ferritin: 100 ng/mL (ref 11–307)

## 2015-04-26 MED ORDER — EPOETIN ALFA 40000 UNIT/ML IJ SOLN
40000.0000 [IU] | Freq: Once | INTRAMUSCULAR | Status: AC
  Administered 2015-04-26: 40000 [IU] via SUBCUTANEOUS
  Filled 2015-04-26: qty 1

## 2015-04-26 NOTE — Progress Notes (Signed)
Follow Up Note  [Authored: 12-Apr-16 09:26]- for Visit: [id number], Complete, Entered, Signed in Full, General  Reason for Visit: This 81 year old Female patient presents to the clinic for follow-up IDA.   Referred by PMD DR Lisette Grinder.  DIAGNOSIS:  Chief Complaint/Diagnosis   History of iron deficiency anemia in the past secondary to AV malformations although had not required iron intravenously from 03 to 09, .  Later, GI f/u with EGD and cautery of duodenal AV malformations, last in 2013, and interval feraheme  SEE ALSO 3/8 NOTE FOR SUMMARY RE PRIOR PULM NODULES, NEG HIV TESTING, BORDERLINE ELEVATED CEA  Current Treatment Plan:  Treatment Plan FfERRAHEME multiple doses    NOTE MOST RECENT PLAN TO TRANFUSE PRBC FOR HGB OF LESS THAN 9G,  OR IF RAPIDLY FALLING HGB OR ANY EVIDENCE OF ACTIVE BLOOD LOSS,  PLAN TO GIVE ADDITIONAL FERAHEME FOR FERRITIN BELOW 100,TO MAINTAIN HGB AND PREVENT .  PROCRIT  INITIALLY 30,000U, WHEN FERRITIN IS ADEQUATE BUT STILL ANEMIC,  PRN FOR HGB 10.2 OR LESS TO TX SYMPTOMATIC ANEMIA OF CHRONIC DISEASE AND AZOTEMIA , THEN INCREASING TO 40,000U   HISTORY OF PRESENT ILLNESS:  HPI   Tanya Townsend presents today for follow-up of IDA. SEE ALSO prior notes for summary. Cntinues to f/u for iron and or blood support prn. feraheme prn,and now since 08/23/14 procrit prn,   S/P HOSPITALIZATION JULY 1/15 FOR 1 UNIT PRBC AND FERAHEME, FOR HGB 8.6 AND FERRITIN 29 .Marland Kitchen HAS HAD REGULAR F/U, LAST FERRITIN 3/15,  AND PROCRIT 3/21. LAST FERRITIN ON 3/31 WAS UP TO 151.  TODAY, FOR F/U. HGB 9.2 VS 11G ON 4/8.Marland Kitchen NO CHANGES IN BOWELS, NO SIGNS ACTIVE BLEEDNG, NO OVERT SYMPTOMS ANEMIA, NO SOB, NO ORTHOSASIS NO PALPITATION, NO CP,  PFSH:  Additional Past Medical and Surgical History A. fibrillation, diabetes, AV malformations of the duodenum, coronary artery disease, MI, hypercholesterol, osteoporosis,  arthrectomy, hx esoph stricture Family history negative for malignancy Social  negative for   tobacco...WAS PRIOR REVEALED SHE WAS DRINKIKING ALCOHOL, CURRENTLY NO REGULAR ALCOHOL USE   ALLERGIES:   Penicillin: Anaphylaxis  Sulfa: Anaphylaxis  Ace Inhibitors: Cough   Additional Allergy Comments Dr. Ouida Sills has notation and patient also remembered that she has tolerated Augmentin in spite of the list of penicillin allergy..ALSO NOTE, in the past took tandem, in july 2011 d/c for nausea and abdominal discomfort   HOME MEDICATIONS: Medication Instructions Last Modified Date/Time  donepezil 10 mg oral tablet 1 tab(s) orally once a day (at bedtime) 08-Mar-16 09:47  metoprolol 100 mg oral tablet, extended release 1 tab(s) orally once a day  08-Mar-16 09:47  simvastatin 80 mg oral tablet 1 tab(s) orally once a day  08-Mar-16 09:47  Caltrate 600+D Soft Chews oral tablet, chewable 1 tab(s) orally once a day (at bedtime) 08-Mar-16 09:47  pantoprazole 40 mg oral enteric coated tablet 1 cap(s) orally once a day  08-Mar-16 09:47  Norvasc 5 mg oral tablet 1 tab(s) orally once a day with supper 08-Mar-16 09:47  Centrum Silver 1 tab(s)  once a day 08-Mar-16 09:47  PreserVision oral tablet 1 tab(s) orally 2 times a day 08-Mar-16 09:47   Additional Medications:  1. DECLINED H1N1   2. flu shot 10/04/09   3. Had the flu shot fall 2014 with Dr walker.   4. FLU SHOT THIS SEASON   ROS:  Review of Systems    no chills, no abdominal pain, no diarrhea, no chest pain, no sob, no palpitation  Physical Exam:  Physical Exam Alert and cooperative, There is mild pallor, no acute distress, lungs clear, abdomen non tender, no palpable organomegaly, , neuro grossly non focal,  no edema lower ext, , heart reg, 116/50 P 60 97.2   Laboratory Results: Routine Hem:  12-Apr-16 08:51   Hemoglobin (CBC)  9.2 (Result(s) reported on 05 Apr 2015 at 09:01AM.)   ASSESSMENT AND PLAN:  Impression   Iron deficiency anemia. See also imp of 07/19/10 ,, , also syncopal episode 11/13/10, then hgb down in dec/11 ,  and blood and feraheme given, see also note /imp dec /11.   ...                                                                                    TODAY  NO  OVERT SIGN  OR SYMPTOMS OF ANEMIA, NO SIGNS OR SYMPTOMS OF ACUTE BLEED, EXAM STABLE,  NOTE RECENT ANEMIA COMBINATION OF BLOOD LOSS AND IRON DEFICIENT, RECENT IRON /FERRITIN REPLENISHED, AND ANEMIA CHRONIC DISEASE AND AZOTEMIA, NOTE PROCRIT RESPONSIVE, , NOTE HGB HIGHEST PRIOR  11.7 WHEN FERRITIN REPLENISHED. , . ALSO, AS PRIOR NOTED, NOT ON PO IRON DUE TO PRIOR INTOLERANCE, ALSO BENEFIT THAT IF STOOLS EVER TURN DARK IS CLEAR SIGN OF ACTIVE BLEEDING . SEE ALSO CURRENT ILLNESS     TODAY HGB IS DOWN TO 9.2 , , LAST HGB LIKELY SOMEWHAT FALSELY ELEVATED  DUE TO HEMOCONCENTRATION., PROBABLY DECLINED MORE GRADUALLY .AS IN CURRENT ILLNESS NO  SIGNS BLEEDING OR SYMPTOMATIC OF ANEMIA. LAST FERITIN 151  Plan    AS PRIOR NOTED...1  WILL TARGET hgb 9 or rapidly falling hgb or symptomatic anemia as targets for transfusion, in the future,  .. 2.give feraheme support  for ferritin below  100  in future   3. PRIOR DISCUSSED POTENTIAL RISKS OF PROCRIT WITH PATIENT AND FAMILY INCLUDING BUT LIMITED TO ACCELERATED GROWTH OF CANCER, INCREASE RISK OF CLOT, MI, STROKE, EDEMA, ACCELERATION OF HIGH BP.  PATIENT DOES NOT WANT TO CONSIDER PORT.   GIVE PROCRIT TODAY CHECK FERRITIN TODAY. TYPE AND SCREEN AND X MATCH FOR ONE UNIT POSSIBLE USE IN 2 DAYS RETURN IN 2 DAYS TO REPEAT HGB, POSSIBLE TRANSFUSION POSSIBLE FERAHEME  Electronic Signatures: Tanya Townsend (MD)  (Signed 12-Apr-16 09:45)  Authored: FOLLOW UP, DIAGNOSIS, CURRENT TREATMENT PLAN, HISTORY OF PRESENT ILLNESS, PFSH, ALLERGIES, ALLERGIES Additional Comments, OUTPATIENT MEDS, MEDICATIONS Additional Comments, ROS, PE, LABORATORY RESULTS, ASSESSMENT AND PLAN   Last Updated: 12-Apr-16 09:45 by Tanya Townsend (MD)      This encounter was created in error - please disregard.

## 2015-04-27 ENCOUNTER — Other Ambulatory Visit: Payer: Self-pay | Admitting: *Deleted

## 2015-05-03 ENCOUNTER — Ambulatory Visit: Payer: Medicare Other | Admitting: Internal Medicine

## 2015-05-03 ENCOUNTER — Ambulatory Visit: Payer: Medicare Other

## 2015-05-03 ENCOUNTER — Inpatient Hospital Stay: Payer: Medicare Other

## 2015-05-03 ENCOUNTER — Other Ambulatory Visit: Payer: Medicare Other

## 2015-05-03 VITALS — BP 114/56 | HR 54 | Temp 97.1°F | Resp 18

## 2015-05-03 DIAGNOSIS — D649 Anemia, unspecified: Secondary | ICD-10-CM

## 2015-05-03 DIAGNOSIS — D509 Iron deficiency anemia, unspecified: Secondary | ICD-10-CM

## 2015-05-03 LAB — HEMOGLOBIN AND HEMATOCRIT, BLOOD
HEMATOCRIT: 33.5 % — AB (ref 35.0–47.0)
HEMOGLOBIN: 10.7 g/dL — AB (ref 12.0–16.0)

## 2015-05-03 MED ORDER — SODIUM CHLORIDE 0.9 % IV SOLN
Freq: Once | INTRAVENOUS | Status: AC
  Administered 2015-05-03: 10:00:00 via INTRAVENOUS
  Filled 2015-05-03: qty 250

## 2015-05-03 MED ORDER — SODIUM CHLORIDE 0.9 % IV SOLN
510.0000 mg | Freq: Once | INTRAVENOUS | Status: AC
  Administered 2015-05-03: 510 mg via INTRAVENOUS
  Filled 2015-05-03: qty 17

## 2015-05-03 MED ORDER — FERUMOXYTOL INJECTION 510 MG/17 ML
INTRAVENOUS | Status: AC
  Filled 2015-05-03: qty 51

## 2015-05-09 ENCOUNTER — Other Ambulatory Visit: Payer: Self-pay | Admitting: *Deleted

## 2015-05-09 DIAGNOSIS — D509 Iron deficiency anemia, unspecified: Secondary | ICD-10-CM

## 2015-05-10 ENCOUNTER — Inpatient Hospital Stay: Payer: Medicare Other

## 2015-05-10 ENCOUNTER — Other Ambulatory Visit: Payer: Self-pay | Admitting: Internal Medicine

## 2015-05-10 ENCOUNTER — Inpatient Hospital Stay: Payer: Medicare Other | Admitting: Internal Medicine

## 2015-05-10 DIAGNOSIS — D638 Anemia in other chronic diseases classified elsewhere: Secondary | ICD-10-CM

## 2015-05-10 DIAGNOSIS — D509 Iron deficiency anemia, unspecified: Secondary | ICD-10-CM | POA: Diagnosis not present

## 2015-05-10 LAB — SAMPLE TO BLOOD BANK

## 2015-05-10 LAB — HEMOGLOBIN: Hemoglobin: 12.1 g/dL (ref 12.0–16.0)

## 2015-05-10 LAB — FERRITIN: FERRITIN: 405 ng/mL — AB (ref 11–307)

## 2015-05-24 ENCOUNTER — Inpatient Hospital Stay (HOSPITAL_BASED_OUTPATIENT_CLINIC_OR_DEPARTMENT_OTHER): Payer: Medicare Other | Admitting: Internal Medicine

## 2015-05-24 ENCOUNTER — Encounter: Payer: Self-pay | Admitting: Internal Medicine

## 2015-05-24 ENCOUNTER — Telehealth: Payer: Self-pay | Admitting: *Deleted

## 2015-05-24 ENCOUNTER — Inpatient Hospital Stay: Payer: Medicare Other

## 2015-05-24 VITALS — BP 122/60 | HR 65 | Temp 96.8°F | Wt 130.1 lb

## 2015-05-24 DIAGNOSIS — M810 Age-related osteoporosis without current pathological fracture: Secondary | ICD-10-CM

## 2015-05-24 DIAGNOSIS — Z79899 Other long term (current) drug therapy: Secondary | ICD-10-CM

## 2015-05-24 DIAGNOSIS — E780 Pure hypercholesterolemia: Secondary | ICD-10-CM

## 2015-05-24 DIAGNOSIS — D638 Anemia in other chronic diseases classified elsewhere: Secondary | ICD-10-CM

## 2015-05-24 DIAGNOSIS — E119 Type 2 diabetes mellitus without complications: Secondary | ICD-10-CM

## 2015-05-24 DIAGNOSIS — D509 Iron deficiency anemia, unspecified: Secondary | ICD-10-CM | POA: Diagnosis not present

## 2015-05-24 DIAGNOSIS — I2510 Atherosclerotic heart disease of native coronary artery without angina pectoris: Secondary | ICD-10-CM

## 2015-05-24 DIAGNOSIS — Q2739 Arteriovenous malformation, other site: Secondary | ICD-10-CM

## 2015-05-24 DIAGNOSIS — I4891 Unspecified atrial fibrillation: Secondary | ICD-10-CM

## 2015-05-24 DIAGNOSIS — I252 Old myocardial infarction: Secondary | ICD-10-CM

## 2015-05-24 LAB — FERRITIN: FERRITIN: 126 ng/mL (ref 11–307)

## 2015-05-24 LAB — HEMOGLOBIN: Hemoglobin: 9.6 g/dL — ABNORMAL LOW (ref 12.0–16.0)

## 2015-05-24 MED ORDER — EPOETIN ALFA 40000 UNIT/ML IJ SOLN
40000.0000 [IU] | Freq: Once | INTRAMUSCULAR | Status: AC
Start: 2015-05-24 — End: 2015-05-24
  Administered 2015-05-24: 40000 [IU] via SUBCUTANEOUS

## 2015-05-24 NOTE — Progress Notes (Addendum)
[facility]  [phone]  Fax:(336) [place]: 07/29/1922  MR#: [id number]  [facility]#:[id number]  No care team member to display  CHIEF COMPLAINT:  Chief Complaint  Patient presents with  . Follow-up    INTERVAL HISTORY:  Patient is here for further evaluation regarding anemia. Hgb has dropped significantly from 05/10/15, 12.1 to 9.6. She has been weak but not more than usual. She has remained active and continues to live alone with 2 daughters in very close proximety to check on her daily. She is for procrit today and ferritin level is still pending. Plan is to keep Ferritin level above 100.  REVIEW OF SYSTEMS:   Review of Systems  HENT: Negative.   Eyes: Negative.   Respiratory: Negative.   Cardiovascular: Negative.   Gastrointestinal: Negative for abdominal pain, blood in stool and melena.  Musculoskeletal: Negative.   Skin: Negative.   Neurological: Positive for dizziness and weakness.  Psychiatric/Behavioral: Positive for memory loss.  All other systems reviewed and are negative.   As per HPI. Otherwise, a complete review of systems is negatve.  ONCOLOGY HISTORY:  No history exists.    PAST MEDICAL HISTORY: Past Medical History  Diagnosis Date  . IDA (iron deficiency anemia)   . Pulmonary nodules   . Elevated carcinoembryonic antigen (CEA)   . History of atrial fibrillation   . AV malformation of gastrointestinal tract     AV malformations of the duodenum  . Coronary artery disease   . History of MI (myocardial infarction)   . Hypercholesteremia   . Osteoporosis   . History of esophageal stricture   . Anxiety   . HTN (hypertension)   . Gout   . DM (diabetes mellitus)   . Metastatic multiple myeloma to bone     Plasma cell myeloma, IgG lambda  . Glaucoma   . Osteoarthritis     PAST SURGICAL HISTORY: Past Surgical History  Procedure Laterality Date  . Tonsillectomy    . Thyroidectomy, partial    .  Appendectomy      FAMILY HISTORY Family History  Problem Relation Age of Onset  . Breast cancer Mother     died of breast cancer at age 22.    GYNECOLOGIC HISTORY:  No LMP recorded.     ADVANCED DIRECTIVES:    HEALTH MAINTENANCE: History  Substance Use Topics  . Smoking status: Former Research scientist (life sciences)  . Smokeless tobacco: Never Used     Comment: quit in 1991  . Alcohol Use: 0.0 oz/week    0 Standard drinks or equivalent per week     Comment: history of alcohol use on a regular basis, but does not drink now per md     Colonoscopy:  PAP:  Bone density:  Lipid panel:  Allergies  Allergen Reactions  . Penicillin G Anaphylaxis  . Sulfa Antibiotics Anaphylaxis  . Ace Inhibitors     Other reaction(s): Cough (finding)  . Penicillin V Potassium     Other reaction(s): Unknown Patient tolerates Augmentin     Current Outpatient Prescriptions  Medication Sig Dispense Refill  . amLODipine (NORVASC) 5 MG tablet Take 5 mg by mouth daily.  3  . Calcium Carb-Cholecalciferol 600-800 MG-UNIT CHEW Chew 1 tablet by mouth at bedtime.    . donepezil (ARICEPT) 10 MG tablet Take 10 mg by mouth at bedtime.  6  . metoprolol succinate (TOPROL-XL) 100 MG 24 hr tablet Take 100 mg by mouth daily.  3  .  Multiple Vitamins-Minerals (CENTRUM SILVER PO) Take 1 tablet by mouth 1 day or 1 dose.    . Multiple Vitamins-Minerals (PRESERVISION AREDS) TABS Take 1 tablet by mouth 1 day or 1 dose.    . pantoprazole (PROTONIX) 40 MG tablet Take 40 mg by mouth daily.  5  . simvastatin (ZOCOR) 80 MG tablet Take 80 mg by mouth daily.  0  . aspirin 81 MG tablet Take 81 mg by mouth daily.    . carvedilol (COREG) 6.25 MG tablet Take 6.25 mg by mouth 2 (two) times daily with a meal.    . dexamethasone (DECADRON) 4 MG tablet Take 20 mg by mouth once a week.    . docusate sodium (COLACE) 100 MG capsule Take 100 mg by mouth 2 (two) times daily.    . furosemide (LASIX) 40 MG tablet Take 40 mg by mouth.    . lactulose  (CHRONULAC) 10 GM/15ML solution Take 10 g by mouth 2 (two) times daily as needed for mild constipation.    . mirtazapine (REMERON) 15 MG tablet Take 15 mg by mouth at bedtime.    Marland Kitchen oxycodone (OXY-IR) 5 MG capsule Take 5 mg by mouth every 6 (six) hours as needed for pain.    . potassium chloride SA (K-DUR,KLOR-CON) 20 MEQ tablet Take 20 mEq by mouth 2 (two) times daily.     No current facility-administered medications for this visit.    OBJECTIVE: BP 122/60 mmHg  Pulse 65  Temp(Src) 96.8 F (36 C) (Tympanic)  Wt 130 lb 1.1 oz (59 kg)  SpO2 99%   Body mass index is 23.81 kg/(m^2).    ECOG FS:1 - Symptomatic but completely ambulatory  General: Well-developed, well-nourished, no acute distress. Lungs: Clear to auscultation bilaterally. Heart: Harsh systolic murmur. Abdomen: Soft, nontender, nondistended. No organomegaly noted, normoactive bowel sounds. Musculoskeletal: No edema, cyanosis, or clubbing. Neuro: Alert, answering all questions appropriately. Cranial nerves grossly intact. Skin: No rashes or petechiae noted. Psych: Normal affect.  LAB RESULTS:     Component Value Date/Time   NA 140 04/19/2015 0852   K 3.6 04/19/2015 0852   CL 102 04/19/2015 0852   CO2 29 04/19/2015 0852   GLUCOSE 118* 04/19/2015 0852   BUN 17 04/19/2015 0852   CREATININE 0.83 04/19/2015 0852   CALCIUM 9.3 04/19/2015 0852   PROT 6.3* 04/19/2015 0852   ALBUMIN 3.5 04/19/2015 0852   AST 30 04/19/2015 0852   ALT 26 04/19/2015 0852   ALKPHOS 71 04/19/2015 0852   GFRNONAA >60 04/19/2015 0852   GFRAA >60 04/19/2015 0852    No results found for: SPEP, UPEP  Lab Results  Component Value Date   WBC 3.4* 04/26/2015   NEUTROABS 2.8 12/01/2013   HGB 9.6* 05/24/2015   HCT 33.5* 05/03/2015   MCV 90.0 04/26/2015   PLT 203 04/26/2015      Chemistry      Component Value Date/Time   NA 140 04/19/2015 0852   K 3.6 04/19/2015 0852   CL 102 04/19/2015 0852   CO2 29 04/19/2015 0852   BUN 17 04/19/2015  0852   CREATININE 0.83 04/19/2015 0852      Component Value Date/Time   CALCIUM 9.3 04/19/2015 0852   ALKPHOS 71 04/19/2015 0852   AST 30 04/19/2015 0852   ALT 26 04/19/2015 0852       No results found for: LABCA2  No components found for: SPQZR007  No results for input(s): INR in the last 168 hours.  No results found  for: COLORURINE, APPEARANCEUR, LABSPEC, PHURINE, GLUCOSEU, HGBUR, BILIRUBINUR, KETONESUR, PROTEINUR, UROBILINOGEN, NITRITE, LEUKOCYTESUR  STUDIES: No results found.  ASSESSMENT:  IDA.  PLAN:   1. IDA. Patient will receive 40,000units of Procrit today as is her usual dose. She does not require Feraheme or venofer at this time.  Discussed options to investigate if there is any acute bleeding but daughter states a colonoscopy was performed approximately 1 year ago. At this time they would like to continue with conservative treatment.   Will schedule weekly labs.  Patient expressed understanding and was in agreement with this plan. She also understands that She can call clinic at any time with any questions, concerns, or complaints.   Dr. Grayland Ormond was available for consultation and review of plan of care for this patient.   Evlyn Kanner, NP   05/24/2015 9:34 AM

## 2015-05-24 NOTE — Telephone Encounter (Signed)
I left a message with Tanya Townsend's husband that Tanya Townsend's Ferritin was 126 as per [employer], AGNP-C.Marland KitchenMarland Kitchen

## 2015-05-25 ENCOUNTER — Other Ambulatory Visit: Payer: Self-pay | Admitting: Family Medicine

## 2015-05-31 ENCOUNTER — Inpatient Hospital Stay: Payer: Medicare Other

## 2015-05-31 ENCOUNTER — Ambulatory Visit: Payer: Federal, State, Local not specified - PPO | Admitting: Internal Medicine

## 2015-05-31 ENCOUNTER — Ambulatory Visit: Payer: Federal, State, Local not specified - PPO

## 2015-05-31 ENCOUNTER — Inpatient Hospital Stay: Payer: Medicare Other | Attending: Internal Medicine

## 2015-05-31 ENCOUNTER — Other Ambulatory Visit: Payer: Federal, State, Local not specified - PPO

## 2015-05-31 ENCOUNTER — Other Ambulatory Visit: Payer: Self-pay | Admitting: Family Medicine

## 2015-05-31 ENCOUNTER — Inpatient Hospital Stay: Payer: Medicare Other | Admitting: Internal Medicine

## 2015-05-31 VITALS — BP 117/64 | HR 63 | Temp 97.5°F | Resp 20

## 2015-05-31 DIAGNOSIS — D631 Anemia in chronic kidney disease: Secondary | ICD-10-CM | POA: Diagnosis not present

## 2015-05-31 DIAGNOSIS — I129 Hypertensive chronic kidney disease with stage 1 through stage 4 chronic kidney disease, or unspecified chronic kidney disease: Secondary | ICD-10-CM | POA: Diagnosis not present

## 2015-05-31 DIAGNOSIS — N189 Chronic kidney disease, unspecified: Secondary | ICD-10-CM | POA: Diagnosis not present

## 2015-05-31 DIAGNOSIS — D638 Anemia in other chronic diseases classified elsewhere: Secondary | ICD-10-CM

## 2015-05-31 LAB — HEMOGLOBIN: Hemoglobin: 9.7 g/dL — ABNORMAL LOW (ref 12.0–16.0)

## 2015-05-31 MED ORDER — EPOETIN ALFA 40000 UNIT/ML IJ SOLN
40000.0000 [IU] | Freq: Once | INTRAMUSCULAR | Status: AC
  Administered 2015-05-31: 40000 [IU] via SUBCUTANEOUS

## 2015-06-07 ENCOUNTER — Inpatient Hospital Stay: Payer: Medicare Other

## 2015-06-07 ENCOUNTER — Other Ambulatory Visit: Payer: Self-pay | Admitting: Family Medicine

## 2015-06-07 VITALS — BP 111/59 | HR 62 | Temp 96.2°F | Resp 20

## 2015-06-07 DIAGNOSIS — D638 Anemia in other chronic diseases classified elsewhere: Secondary | ICD-10-CM

## 2015-06-07 DIAGNOSIS — I129 Hypertensive chronic kidney disease with stage 1 through stage 4 chronic kidney disease, or unspecified chronic kidney disease: Secondary | ICD-10-CM | POA: Diagnosis not present

## 2015-06-07 LAB — IRON AND TIBC
Iron: 22 ug/dL — ABNORMAL LOW (ref 28–170)
Saturation Ratios: 8 % — ABNORMAL LOW (ref 10.4–31.8)
TIBC: 278 ug/dL (ref 250–450)
UIBC: 257 ug/dL

## 2015-06-07 LAB — FERRITIN: Ferritin: 44 ng/mL (ref 11–307)

## 2015-06-07 LAB — HEMOGLOBIN: Hemoglobin: 9.2 g/dL — ABNORMAL LOW (ref 12.0–16.0)

## 2015-06-07 MED ORDER — EPOETIN ALFA 40000 UNIT/ML IJ SOLN
40000.0000 [IU] | Freq: Once | INTRAMUSCULAR | Status: AC
  Administered 2015-06-07: 40000 [IU] via SUBCUTANEOUS
  Filled 2015-06-07: qty 1

## 2015-06-10 ENCOUNTER — Inpatient Hospital Stay: Payer: Medicare Other

## 2015-06-13 ENCOUNTER — Inpatient Hospital Stay: Payer: Medicare Other

## 2015-06-13 DIAGNOSIS — D638 Anemia in other chronic diseases classified elsewhere: Secondary | ICD-10-CM

## 2015-06-13 DIAGNOSIS — I129 Hypertensive chronic kidney disease with stage 1 through stage 4 chronic kidney disease, or unspecified chronic kidney disease: Secondary | ICD-10-CM | POA: Diagnosis not present

## 2015-06-13 DIAGNOSIS — D509 Iron deficiency anemia, unspecified: Secondary | ICD-10-CM

## 2015-06-13 MED ORDER — SODIUM CHLORIDE 0.9 % IV SOLN
Freq: Once | INTRAVENOUS | Status: AC
Start: 2015-06-13 — End: 2015-06-13
  Administered 2015-06-13: 10:00:00 via INTRAVENOUS
  Filled 2015-06-13: qty 1000

## 2015-06-13 MED ORDER — SODIUM CHLORIDE 0.9 % IV SOLN
510.0000 mg | Freq: Once | INTRAVENOUS | Status: AC
  Administered 2015-06-13: 510 mg via INTRAVENOUS
  Filled 2015-06-13: qty 17

## 2015-06-14 ENCOUNTER — Inpatient Hospital Stay: Payer: Medicare Other

## 2015-06-17 ENCOUNTER — Ambulatory Visit: Payer: Federal, State, Local not specified - PPO

## 2015-06-20 ENCOUNTER — Inpatient Hospital Stay: Payer: Medicare Other

## 2015-06-20 ENCOUNTER — Telehealth: Payer: Self-pay | Admitting: Family Medicine

## 2015-06-20 VITALS — BP 123/60 | HR 60 | Temp 96.5°F | Resp 20

## 2015-06-20 DIAGNOSIS — D638 Anemia in other chronic diseases classified elsewhere: Secondary | ICD-10-CM

## 2015-06-20 DIAGNOSIS — D509 Iron deficiency anemia, unspecified: Secondary | ICD-10-CM

## 2015-06-20 DIAGNOSIS — I129 Hypertensive chronic kidney disease with stage 1 through stage 4 chronic kidney disease, or unspecified chronic kidney disease: Secondary | ICD-10-CM | POA: Diagnosis not present

## 2015-06-20 MED ORDER — SODIUM CHLORIDE 0.9 % IV SOLN
Freq: Once | INTRAVENOUS | Status: AC
  Administered 2015-06-20: 10:00:00 via INTRAVENOUS
  Filled 2015-06-20: qty 1000

## 2015-06-20 MED ORDER — ALTEPLASE 2 MG IJ SOLR
2.0000 mg | Freq: Once | INTRAMUSCULAR | Status: DC | PRN
  Filled 2015-06-20: qty 2

## 2015-06-20 MED ORDER — FERUMOXYTOL INJECTION 510 MG/17 ML
510.0000 mg | Freq: Once | INTRAVENOUS | Status: AC
  Administered 2015-06-20: 510 mg via INTRAVENOUS
  Filled 2015-06-20: qty 17

## 2015-06-20 NOTE — Telephone Encounter (Signed)
He came to my desk after I checked her in and said she has been complaining over the past week that she has no energy, she has been wheezing, and just "isn't herself."

## 2015-06-21 ENCOUNTER — Inpatient Hospital Stay: Payer: Medicare Other

## 2015-06-28 ENCOUNTER — Inpatient Hospital Stay: Payer: Medicare Other

## 2015-07-12 ENCOUNTER — Inpatient Hospital Stay: Payer: Medicare Other

## 2015-07-12 ENCOUNTER — Other Ambulatory Visit: Payer: Self-pay | Admitting: Family Medicine

## 2015-07-12 ENCOUNTER — Inpatient Hospital Stay (HOSPITAL_BASED_OUTPATIENT_CLINIC_OR_DEPARTMENT_OTHER): Payer: Medicare Other | Admitting: Family Medicine

## 2015-07-12 ENCOUNTER — Inpatient Hospital Stay: Payer: Medicare Other | Attending: Internal Medicine

## 2015-07-12 VITALS — BP 126/62 | HR 57 | Temp 97.0°F | Resp 18 | Wt 129.2 lb

## 2015-07-12 DIAGNOSIS — D638 Anemia in other chronic diseases classified elsewhere: Secondary | ICD-10-CM

## 2015-07-12 DIAGNOSIS — E119 Type 2 diabetes mellitus without complications: Secondary | ICD-10-CM

## 2015-07-12 DIAGNOSIS — I129 Hypertensive chronic kidney disease with stage 1 through stage 4 chronic kidney disease, or unspecified chronic kidney disease: Secondary | ICD-10-CM

## 2015-07-12 DIAGNOSIS — I4891 Unspecified atrial fibrillation: Secondary | ICD-10-CM | POA: Insufficient documentation

## 2015-07-12 DIAGNOSIS — Z79899 Other long term (current) drug therapy: Secondary | ICD-10-CM | POA: Insufficient documentation

## 2015-07-12 DIAGNOSIS — Z87891 Personal history of nicotine dependence: Secondary | ICD-10-CM | POA: Diagnosis not present

## 2015-07-12 DIAGNOSIS — I2510 Atherosclerotic heart disease of native coronary artery without angina pectoris: Secondary | ICD-10-CM

## 2015-07-12 DIAGNOSIS — E780 Pure hypercholesterolemia: Secondary | ICD-10-CM | POA: Insufficient documentation

## 2015-07-12 DIAGNOSIS — D631 Anemia in chronic kidney disease: Secondary | ICD-10-CM | POA: Diagnosis not present

## 2015-07-12 DIAGNOSIS — D509 Iron deficiency anemia, unspecified: Secondary | ICD-10-CM | POA: Diagnosis not present

## 2015-07-12 DIAGNOSIS — I252 Old myocardial infarction: Secondary | ICD-10-CM | POA: Diagnosis not present

## 2015-07-12 DIAGNOSIS — N189 Chronic kidney disease, unspecified: Secondary | ICD-10-CM | POA: Insufficient documentation

## 2015-07-12 LAB — HEMOGLOBIN: Hemoglobin: 9.3 g/dL — ABNORMAL LOW (ref 12.0–16.0)

## 2015-07-12 MED ORDER — EPOETIN ALFA 40000 UNIT/ML IJ SOLN
40000.0000 [IU] | Freq: Once | INTRAMUSCULAR | Status: AC
  Administered 2015-07-12: 40000 [IU] via SUBCUTANEOUS

## 2015-07-12 NOTE — Progress Notes (Signed)
[facility]  Telephone:(336) [phone]  Fax:(336) [place]: 03/31/1922  MR#: [id number]  [facility]#:[id number]  No care team member to display  CHIEF COMPLAINT:  Chief Complaint  Patient presents with  . Follow-up   IDA, related to CKD.  INTERVAL HISTORY:  Patient is here for further evaluation regarding anemia. Hgb is steady at 9.3 today.  Her ferritin was noted to be low in June and she received 2 infusions of feraheme. She has been weak but not more than usual. She has remained active and continues to live alone with 2 daughters in very close proximety to check on her daily. She is for procrit today. Plan is to keep Ferritin level above 100.  REVIEW OF SYSTEMS:   Review of Systems  Constitutional: Negative for fever, chills, weight loss, malaise/fatigue and diaphoresis.  HENT: Negative.  Negative for congestion, ear discharge, ear pain, hearing loss, nosebleeds, sore throat and tinnitus.   Eyes: Negative.  Negative for blurred vision, double vision, photophobia, pain, discharge and redness.  Respiratory: Negative.  Negative for cough, hemoptysis, sputum production, shortness of breath, wheezing and stridor.   Cardiovascular: Negative.  Negative for chest pain, palpitations, orthopnea, claudication, leg swelling and PND.  Gastrointestinal: Negative for heartburn, nausea, vomiting, abdominal pain, diarrhea, constipation, blood in stool and melena.  Genitourinary: Negative.   Musculoskeletal: Negative.   Skin: Negative.   Neurological: Positive for weakness. Negative for dizziness, tingling, focal weakness, seizures and headaches.  Endo/Heme/Allergies: Does not bruise/bleed easily.  Psychiatric/Behavioral: Positive for memory loss. Negative for depression. The patient is not nervous/anxious and does not have insomnia.   All other systems reviewed and are negative.   As per HPI. Otherwise, a complete review of systems is negatve.   PAST MEDICAL  HISTORY: Past Medical History  Diagnosis Date  . IDA (iron deficiency anemia)   . Pulmonary nodules   . Elevated carcinoembryonic antigen (CEA)   . History of atrial fibrillation   . AV malformation of gastrointestinal tract     AV malformations of the duodenum  . Coronary artery disease   . History of MI (myocardial infarction)   . Hypercholesteremia   . Osteoporosis   . History of esophageal stricture   . Anxiety   . HTN (hypertension)   . Gout   . DM (diabetes mellitus)   . Metastatic multiple myeloma to bone     Plasma cell myeloma, IgG lambda  . Glaucoma   . Osteoarthritis     PAST SURGICAL HISTORY: Past Surgical History  Procedure Laterality Date  . Tonsillectomy    . Thyroidectomy, partial    . Appendectomy      FAMILY HISTORY Family History  Problem Relation Age of Onset  . Breast cancer Mother     died of breast cancer at age 46.    GYNECOLOGIC HISTORY:  No LMP recorded.     ADVANCED DIRECTIVES:    HEALTH MAINTENANCE: History  Substance Use Topics  . Smoking status: Former Research scientist (life sciences)  . Smokeless tobacco: Never Used     Comment: quit in 1991  . Alcohol Use: 0.0 oz/week    0 Standard drinks or equivalent per week     Comment: history of alcohol use on a regular basis, but does not drink now per md     Colonoscopy:  PAP:  Bone density:  Lipid panel:  Allergies  Allergen Reactions  . Penicillin G Anaphylaxis  . Sulfa Antibiotics Anaphylaxis  . Ace Inhibitors  Other reaction(s): Cough (finding)  . Penicillin V Potassium     Other reaction(s): Unknown Patient tolerates Augmentin     Current Outpatient Prescriptions  Medication Sig Dispense Refill  . amLODipine (NORVASC) 5 MG tablet Take 5 mg by mouth daily.  3  . aspirin 81 MG tablet Take 81 mg by mouth daily.    . Calcium Carb-Cholecalciferol 600-800 MG-UNIT CHEW Chew 1 tablet by mouth at bedtime.    . carvedilol (COREG) 6.25 MG tablet Take 6.25 mg by mouth 2 (two) times daily with a  meal.    . dexamethasone (DECADRON) 4 MG tablet Take 20 mg by mouth once a week.    . docusate sodium (COLACE) 100 MG capsule Take 100 mg by mouth 2 (two) times daily.    Marland Kitchen donepezil (ARICEPT) 10 MG tablet Take 10 mg by mouth at bedtime.  6  . furosemide (LASIX) 40 MG tablet Take 40 mg by mouth.    . lactulose (CHRONULAC) 10 GM/15ML solution Take 10 g by mouth 2 (two) times daily as needed for mild constipation.    . metoprolol succinate (TOPROL-XL) 100 MG 24 hr tablet Take 100 mg by mouth daily.  3  . mirtazapine (REMERON) 15 MG tablet Take 15 mg by mouth at bedtime.    . Multiple Vitamins-Minerals (CENTRUM SILVER PO) Take 1 tablet by mouth 1 day or 1 dose.    . Multiple Vitamins-Minerals (PRESERVISION AREDS) TABS Take 1 tablet by mouth 1 day or 1 dose.    Marland Kitchen oxycodone (OXY-IR) 5 MG capsule Take 5 mg by mouth every 6 (six) hours as needed for pain.    . pantoprazole (PROTONIX) 40 MG tablet Take 40 mg by mouth daily.  5  . potassium chloride SA (K-DUR,KLOR-CON) 20 MEQ tablet Take 20 mEq by mouth 2 (two) times daily.    . simvastatin (ZOCOR) 80 MG tablet Take 80 mg by mouth daily.  0   No current facility-administered medications for this visit.    OBJECTIVE: BP 126/62 mmHg  Pulse 57  Temp(Src) 97 F (36.1 C) (Tympanic)  Resp 18  Wt 129 lb 3 oz (58.6 kg)  SpO2 99%   Body mass index is 23.65 kg/(m^2).    ECOG FS:1 - Symptomatic but completely ambulatory  General: Well-developed, well-nourished, no acute distress. Lungs: Clear to auscultation bilaterally. Heart: Harsh systolic murmur. Abdomen: Soft, nontender, nondistended. No organomegaly noted, normoactive bowel sounds. Musculoskeletal: No edema, cyanosis, or clubbing. Neuro: Alert, answering all questions appropriately. Cranial nerves grossly intact. Skin: No rashes or petechiae noted. Psych: Normal affect.  LAB RESULTS:     Component Value Date/Time   NA 140 04/19/2015 0852   K 3.6 04/19/2015 0852   CL 102 04/19/2015 0852    CO2 29 04/19/2015 0852   GLUCOSE 118* 04/19/2015 0852   BUN 17 04/19/2015 0852   CREATININE 0.83 04/19/2015 0852   CALCIUM 9.3 04/19/2015 0852   PROT 6.3* 04/19/2015 0852   ALBUMIN 3.5 04/19/2015 0852   AST 30 04/19/2015 0852   ALT 26 04/19/2015 0852   ALKPHOS 71 04/19/2015 0852   GFRNONAA >60 04/19/2015 0852   GFRAA >60 04/19/2015 0852    No results found for: SPEP, UPEP  Lab Results  Component Value Date   WBC 3.4* 04/26/2015   NEUTROABS 2.8 12/01/2013   HGB 9.3* 07/12/2015   HCT 33.5* 05/03/2015   MCV 90.0 04/26/2015   PLT 203 04/26/2015      Chemistry      Component Value  Date/Time   NA 140 04/19/2015 0852   K 3.6 04/19/2015 0852   CL 102 04/19/2015 0852   CO2 29 04/19/2015 0852   BUN 17 04/19/2015 0852   CREATININE 0.83 04/19/2015 0852      Component Value Date/Time   CALCIUM 9.3 04/19/2015 0852   ALKPHOS 71 04/19/2015 0852   AST 30 04/19/2015 0852   ALT 26 04/19/2015 0852       No results found for: LABCA2  No components found for: [id number]  No results for input(s): INR in the last 168 hours.  No results found for: COLORURINE, APPEARANCEUR, LABSPEC, PHURINE, GLUCOSEU, HGBUR, BILIRUBINUR, KETONESUR, PROTEINUR, UROBILINOGEN, NITRITE, LEUKOCYTESUR  STUDIES: No results found.  ASSESSMENT:  IDA.  PLAN:   1. IDA. Patient will receive 40,000units of Procrit today as is her usual dose. Discussed options to investigate if there is any acute bleeding but daughter states a colonoscopy was performed approximately 1 year ago. At this time they would like to continue with conservative treatment.  Will reevaluate ferritin and iron levels prior to next provider visit.   Will schedule weekly labs and procrit for the next 8 weeks with a provider visit in 8 weeks as well.  Patient expressed understanding and was in agreement with this plan. She also understands that She can call clinic at any time with any questions, concerns, or complaints.   Dr. Grayland Ormond was  available for consultation and review of plan of care for this patient.   Evlyn Kanner, NP   07/12/2015 9:27 AM

## 2015-07-19 ENCOUNTER — Inpatient Hospital Stay: Payer: Medicare Other

## 2015-07-19 ENCOUNTER — Other Ambulatory Visit: Payer: Self-pay | Admitting: *Deleted

## 2015-07-19 VITALS — BP 119/54 | HR 57 | Temp 97.7°F | Resp 18

## 2015-07-19 DIAGNOSIS — D638 Anemia in other chronic diseases classified elsewhere: Secondary | ICD-10-CM

## 2015-07-19 DIAGNOSIS — D509 Iron deficiency anemia, unspecified: Secondary | ICD-10-CM

## 2015-07-19 DIAGNOSIS — I129 Hypertensive chronic kidney disease with stage 1 through stage 4 chronic kidney disease, or unspecified chronic kidney disease: Secondary | ICD-10-CM | POA: Diagnosis not present

## 2015-07-19 LAB — CBC WITH DIFFERENTIAL/PLATELET
BASOS ABS: 0 10*3/uL (ref 0–0.1)
Basophils Relative: 1 %
EOS ABS: 0.1 10*3/uL (ref 0–0.7)
Eosinophils Relative: 2 %
HCT: 29.1 % — ABNORMAL LOW (ref 35.0–47.0)
Hemoglobin: 9.4 g/dL — ABNORMAL LOW (ref 12.0–16.0)
Lymphocytes Relative: 13 %
Lymphs Abs: 0.5 10*3/uL — ABNORMAL LOW (ref 1.0–3.6)
MCH: 30.6 pg (ref 26.0–34.0)
MCHC: 32.5 g/dL (ref 32.0–36.0)
MCV: 94.2 fL (ref 80.0–100.0)
Monocytes Absolute: 0.3 10*3/uL (ref 0.2–0.9)
Monocytes Relative: 7 %
NEUTROS ABS: 2.9 10*3/uL (ref 1.4–6.5)
Neutrophils Relative %: 77 %
Platelets: 193 10*3/uL (ref 150–440)
RBC: 3.09 MIL/uL — AB (ref 3.80–5.20)
RDW: 20.6 % — ABNORMAL HIGH (ref 11.5–14.5)
WBC: 3.8 10*3/uL (ref 3.6–11.0)

## 2015-07-19 LAB — IRON AND TIBC
Iron: 38 ug/dL (ref 28–170)
Saturation Ratios: 15 % (ref 10.4–31.8)
TIBC: 248 ug/dL — ABNORMAL LOW (ref 250–450)
UIBC: 210 ug/dL

## 2015-07-19 LAB — FERRITIN: FERRITIN: 71 ng/mL (ref 11–307)

## 2015-07-19 MED ORDER — EPOETIN ALFA 40000 UNIT/ML IJ SOLN
40000.0000 [IU] | INTRAMUSCULAR | Status: DC
Start: 2015-07-19 — End: 2015-07-19
  Administered 2015-07-19: 40000 [IU] via SUBCUTANEOUS
  Filled 2015-07-19: qty 1

## 2015-07-26 ENCOUNTER — Inpatient Hospital Stay: Payer: Medicare Other

## 2015-07-26 ENCOUNTER — Inpatient Hospital Stay: Payer: Medicare Other | Attending: Internal Medicine

## 2015-07-26 VITALS — BP 122/54 | HR 60 | Temp 96.8°F | Resp 18

## 2015-07-26 DIAGNOSIS — D638 Anemia in other chronic diseases classified elsewhere: Secondary | ICD-10-CM

## 2015-07-26 DIAGNOSIS — Z79899 Other long term (current) drug therapy: Secondary | ICD-10-CM | POA: Insufficient documentation

## 2015-07-26 DIAGNOSIS — D509 Iron deficiency anemia, unspecified: Secondary | ICD-10-CM | POA: Diagnosis not present

## 2015-07-26 LAB — HEMOGLOBIN: HEMOGLOBIN: 8.6 g/dL — AB (ref 12.0–16.0)

## 2015-07-26 MED ORDER — EPOETIN ALFA 40000 UNIT/ML IJ SOLN
40000.0000 [IU] | Freq: Once | INTRAMUSCULAR | Status: AC
  Administered 2015-07-26: 40000 [IU] via SUBCUTANEOUS
  Filled 2015-07-26: qty 1

## 2015-08-02 ENCOUNTER — Inpatient Hospital Stay: Payer: Medicare Other

## 2015-08-02 ENCOUNTER — Other Ambulatory Visit: Payer: Self-pay | Admitting: Family Medicine

## 2015-08-02 DIAGNOSIS — D509 Iron deficiency anemia, unspecified: Secondary | ICD-10-CM | POA: Diagnosis not present

## 2015-08-02 DIAGNOSIS — D508 Other iron deficiency anemias: Secondary | ICD-10-CM

## 2015-08-02 DIAGNOSIS — D638 Anemia in other chronic diseases classified elsewhere: Secondary | ICD-10-CM

## 2015-08-02 LAB — HEMOGLOBIN: HEMOGLOBIN: 7.5 g/dL — AB (ref 12.0–16.0)

## 2015-08-02 LAB — FERRITIN: Ferritin: 26 ng/mL (ref 11–307)

## 2015-08-02 LAB — IRON AND TIBC
IRON: 17 ug/dL — AB (ref 28–170)
SATURATION RATIOS: 6 % — AB (ref 10.4–31.8)
TIBC: 302 ug/dL (ref 250–450)
UIBC: 285 ug/dL

## 2015-08-02 LAB — PREPARE RBC (CROSSMATCH)

## 2015-08-02 LAB — ABO/RH: ABO/RH(D): A NEG

## 2015-08-02 MED ORDER — DIPHENHYDRAMINE HCL 25 MG PO CAPS: 25.0000 mg | ORAL_CAPSULE | Freq: Once | ORAL | Status: AC

## 2015-08-02 MED ORDER — ACETAMINOPHEN 325 MG PO TABS: 650.0000 mg | ORAL_TABLET | Freq: Once | ORAL | Status: AC

## 2015-08-02 MED ORDER — SODIUM CHLORIDE 0.9 % IV SOLN
250.0000 mL | Freq: Once | INTRAVENOUS | Status: AC
  Filled 2015-08-02: qty 250

## 2015-08-03 ENCOUNTER — Inpatient Hospital Stay: Payer: Medicare Other

## 2015-08-03 VITALS — BP 120/65 | HR 54 | Temp 98.0°F | Resp 18

## 2015-08-03 DIAGNOSIS — D509 Iron deficiency anemia, unspecified: Secondary | ICD-10-CM | POA: Diagnosis not present

## 2015-08-03 DIAGNOSIS — D638 Anemia in other chronic diseases classified elsewhere: Secondary | ICD-10-CM

## 2015-08-03 MED ORDER — DIPHENHYDRAMINE HCL 25 MG PO CAPS
25.0000 mg | ORAL_CAPSULE | Freq: Once | ORAL | Status: AC
  Administered 2015-08-03: 25 mg via ORAL

## 2015-08-03 MED ORDER — ACETAMINOPHEN 325 MG PO TABS
650.0000 mg | ORAL_TABLET | Freq: Once | ORAL | Status: AC
Start: 2015-08-03 — End: 2015-08-03
  Administered 2015-08-03: 650 mg via ORAL

## 2015-08-03 MED ORDER — SODIUM CHLORIDE 0.9 % IV SOLN
INTRAVENOUS | Status: DC
  Administered 2015-08-03: 12:00:00 via INTRAVENOUS
  Filled 2015-08-03: qty 1000

## 2015-08-04 LAB — TYPE AND SCREEN
ABO/RH(D): A NEG
ANTIBODY SCREEN: NEGATIVE
UNIT DIVISION: 0

## 2015-08-05 ENCOUNTER — Other Ambulatory Visit: Payer: Self-pay | Admitting: Family Medicine

## 2015-08-05 ENCOUNTER — Inpatient Hospital Stay: Payer: Medicare Other

## 2015-08-05 VITALS — BP 107/54 | HR 55 | Temp 97.4°F | Resp 18

## 2015-08-05 DIAGNOSIS — D638 Anemia in other chronic diseases classified elsewhere: Secondary | ICD-10-CM

## 2015-08-05 DIAGNOSIS — D509 Iron deficiency anemia, unspecified: Secondary | ICD-10-CM | POA: Diagnosis not present

## 2015-08-05 MED ORDER — SODIUM CHLORIDE 0.9 % IV SOLN
Freq: Once | INTRAVENOUS | Status: AC
Start: 2015-08-05 — End: 2015-08-05
  Administered 2015-08-05: 10:00:00 via INTRAVENOUS
  Filled 2015-08-05: qty 1000

## 2015-08-05 MED ORDER — SODIUM CHLORIDE 0.9 % IV SOLN
510.0000 mg | Freq: Once | INTRAVENOUS | Status: AC
  Administered 2015-08-05: 510 mg via INTRAVENOUS
  Filled 2015-08-05: qty 17

## 2015-08-09 ENCOUNTER — Inpatient Hospital Stay: Payer: Medicare Other

## 2015-08-09 ENCOUNTER — Other Ambulatory Visit: Payer: Self-pay | Admitting: Family Medicine

## 2015-08-09 VITALS — BP 113/58 | HR 67 | Temp 96.8°F | Resp 20

## 2015-08-09 DIAGNOSIS — D638 Anemia in other chronic diseases classified elsewhere: Secondary | ICD-10-CM

## 2015-08-09 DIAGNOSIS — D509 Iron deficiency anemia, unspecified: Secondary | ICD-10-CM

## 2015-08-09 LAB — HEMOGLOBIN: Hemoglobin: 9.9 g/dL — ABNORMAL LOW (ref 12.0–16.0)

## 2015-08-09 MED ORDER — EPOETIN ALFA 40000 UNIT/ML IJ SOLN
40000.0000 [IU] | INTRAMUSCULAR | Status: DC
Start: 2015-08-09 — End: 2015-08-09
  Administered 2015-08-09: 40000 [IU] via SUBCUTANEOUS

## 2015-08-12 ENCOUNTER — Inpatient Hospital Stay: Payer: Medicare Other

## 2015-08-12 VITALS — BP 108/59 | HR 66 | Temp 97.0°F | Resp 18

## 2015-08-12 DIAGNOSIS — D638 Anemia in other chronic diseases classified elsewhere: Secondary | ICD-10-CM

## 2015-08-12 DIAGNOSIS — D509 Iron deficiency anemia, unspecified: Secondary | ICD-10-CM | POA: Diagnosis not present

## 2015-08-12 MED ORDER — SODIUM CHLORIDE 0.9 % IV SOLN
Freq: Once | INTRAVENOUS | Status: AC
  Administered 2015-08-12: 09:00:00 via INTRAVENOUS
  Filled 2015-08-12: qty 1000

## 2015-08-12 MED ORDER — FERUMOXYTOL INJECTION 510 MG/17 ML
510.0000 mg | Freq: Once | INTRAVENOUS | Status: AC
Start: 2015-08-12 — End: 2015-08-12
  Administered 2015-08-12: 510 mg via INTRAVENOUS
  Filled 2015-08-12: qty 17

## 2015-08-16 ENCOUNTER — Other Ambulatory Visit: Payer: Self-pay | Admitting: Family Medicine

## 2015-08-16 ENCOUNTER — Inpatient Hospital Stay: Payer: Medicare Other

## 2015-08-16 DIAGNOSIS — D509 Iron deficiency anemia, unspecified: Secondary | ICD-10-CM | POA: Diagnosis not present

## 2015-08-16 DIAGNOSIS — D638 Anemia in other chronic diseases classified elsewhere: Secondary | ICD-10-CM

## 2015-08-16 LAB — HEMOGLOBIN: Hemoglobin: 10.9 g/dL — ABNORMAL LOW (ref 12.0–16.0)

## 2015-08-23 ENCOUNTER — Inpatient Hospital Stay: Payer: Medicare Other

## 2015-08-23 DIAGNOSIS — D638 Anemia in other chronic diseases classified elsewhere: Secondary | ICD-10-CM

## 2015-08-23 DIAGNOSIS — D509 Iron deficiency anemia, unspecified: Secondary | ICD-10-CM | POA: Diagnosis not present

## 2015-08-23 LAB — HEMOGLOBIN: HEMOGLOBIN: 11.8 g/dL — AB (ref 12.0–16.0)

## 2015-08-30 ENCOUNTER — Inpatient Hospital Stay: Payer: Medicare Other

## 2015-08-30 ENCOUNTER — Inpatient Hospital Stay: Payer: Medicare Other | Attending: Internal Medicine

## 2015-08-30 DIAGNOSIS — R634 Abnormal weight loss: Secondary | ICD-10-CM | POA: Diagnosis not present

## 2015-08-30 DIAGNOSIS — Z79899 Other long term (current) drug therapy: Secondary | ICD-10-CM | POA: Insufficient documentation

## 2015-08-30 DIAGNOSIS — Q2733 Arteriovenous malformation of digestive system vessel: Secondary | ICD-10-CM | POA: Insufficient documentation

## 2015-08-30 DIAGNOSIS — Z23 Encounter for immunization: Secondary | ICD-10-CM | POA: Insufficient documentation

## 2015-08-30 DIAGNOSIS — R630 Anorexia: Secondary | ICD-10-CM | POA: Diagnosis not present

## 2015-08-30 DIAGNOSIS — N189 Chronic kidney disease, unspecified: Secondary | ICD-10-CM | POA: Diagnosis not present

## 2015-08-30 DIAGNOSIS — D638 Anemia in other chronic diseases classified elsewhere: Secondary | ICD-10-CM

## 2015-08-30 DIAGNOSIS — D509 Iron deficiency anemia, unspecified: Secondary | ICD-10-CM

## 2015-08-30 DIAGNOSIS — F0390 Unspecified dementia without behavioral disturbance: Secondary | ICD-10-CM | POA: Insufficient documentation

## 2015-08-30 LAB — HEMOGLOBIN: HEMOGLOBIN: 10.7 g/dL — AB (ref 12.0–16.0)

## 2015-09-06 ENCOUNTER — Inpatient Hospital Stay: Payer: Medicare Other

## 2015-09-06 ENCOUNTER — Ambulatory Visit: Payer: Federal, State, Local not specified - PPO

## 2015-09-12 ENCOUNTER — Other Ambulatory Visit: Payer: Self-pay | Admitting: Internal Medicine

## 2015-09-12 DIAGNOSIS — D638 Anemia in other chronic diseases classified elsewhere: Secondary | ICD-10-CM

## 2015-09-13 ENCOUNTER — Inpatient Hospital Stay: Payer: Medicare Other

## 2015-09-13 ENCOUNTER — Inpatient Hospital Stay (HOSPITAL_BASED_OUTPATIENT_CLINIC_OR_DEPARTMENT_OTHER): Payer: Medicare Other | Admitting: Internal Medicine

## 2015-09-13 ENCOUNTER — Encounter: Payer: Self-pay | Admitting: Internal Medicine

## 2015-09-13 VITALS — BP 124/59 | HR 66 | Temp 96.9°F | Resp 18 | Ht 61.97 in | Wt 123.5 lb

## 2015-09-13 DIAGNOSIS — Z23 Encounter for immunization: Secondary | ICD-10-CM

## 2015-09-13 DIAGNOSIS — D509 Iron deficiency anemia, unspecified: Secondary | ICD-10-CM

## 2015-09-13 DIAGNOSIS — Z79899 Other long term (current) drug therapy: Secondary | ICD-10-CM

## 2015-09-13 DIAGNOSIS — D638 Anemia in other chronic diseases classified elsewhere: Secondary | ICD-10-CM

## 2015-09-13 LAB — CBC WITH DIFFERENTIAL/PLATELET
BASOS PCT: 1 %
Basophils Absolute: 0 10*3/uL (ref 0–0.1)
EOS ABS: 0.1 10*3/uL (ref 0–0.7)
Eosinophils Relative: 2 %
HCT: 31.1 % — ABNORMAL LOW (ref 35.0–47.0)
Hemoglobin: 10.2 g/dL — ABNORMAL LOW (ref 12.0–16.0)
Lymphocytes Relative: 12 %
Lymphs Abs: 0.6 10*3/uL — ABNORMAL LOW (ref 1.0–3.6)
MCH: 30.4 pg (ref 26.0–34.0)
MCHC: 32.9 g/dL (ref 32.0–36.0)
MCV: 92.4 fL (ref 80.0–100.0)
MONO ABS: 0.2 10*3/uL (ref 0.2–0.9)
MONOS PCT: 5 %
NEUTROS PCT: 80 %
Neutro Abs: 3.6 10*3/uL (ref 1.4–6.5)
PLATELETS: 216 10*3/uL (ref 150–440)
RBC: 3.37 MIL/uL — ABNORMAL LOW (ref 3.80–5.20)
RDW: 17.6 % — AB (ref 11.5–14.5)
WBC: 4.5 10*3/uL (ref 3.6–11.0)

## 2015-09-13 LAB — SAMPLE TO BLOOD BANK

## 2015-09-13 MED ORDER — INFLUENZA VAC SPLIT QUAD 0.5 ML IM SUSY
0.5000 mL | PREFILLED_SYRINGE | Freq: Once | INTRAMUSCULAR | Status: AC
  Administered 2015-09-13: 0.5 mL via INTRAMUSCULAR
  Filled 2015-09-13: qty 0.5

## 2015-09-13 NOTE — Assessment & Plan Note (Signed)
Anemia-multifactorial iron deficiency/question CKD vs ? undiagnosed myelodysplastic syndrome [bone marrow biopsy never done]. Patient's hemoglobin has been fairly stable currently on Procrit on a weekly basis; also Feraheme as needed.   The patient's hemoglobin is 10.2; hence I would not recommend Procrit. For now recommend continued weekly hemoglobin checks; Procrit for  hemoglobin less than 10; hematocrit less than 30.   I had a long discussion the patient's daughter who is very involved in her care regarding the possibility of an underlying low-grade myelodysplastic syndrome; discuss the pathology /biology/clinical course of the disease that it could eventually returned to acute leukemia. Again she understands that only way to confirm the diagnosis is the bone marrow biopsy. As currently this would not change her course of therapy given her age/dementia/frailty-  i agree with Holding a bone marrow biopsy  at this time. However if she stops responding to Procrit/iron infusions bone marrow biopsy could be indicated.  This was discussed at length with the daughter who agrees with the plan.

## 2015-09-13 NOTE — Progress Notes (Signed)
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 Unfortunately patient is a poor historian given her age/dementia; has most of the history is taken talking to her daughter.  As per the daughter, no blood in stools or black stools; no nausea no vomiting abdominal pain. She has lost some weight; because of poor appetite.  As per the daughter, patient has mild shortness of breath with exertion; however no significant shortness of breath at rest.   Patient has not had any recent colonoscopy or EGD. Patient last received a transfusion in August 2016.   Review of systems: Difficult to assess given patient's dementia.  I have reviewed the past medical history, past surgical history, social history and family history with the patient and they are unchanged from previous note unless stated above.  ALLERGIES:  is allergic to penicillin g; sulfa antibiotics; ace inhibitors; and penicillin v potassium.  MEDICATIONS:  Current Outpatient Prescriptions  Medication Sig Dispense Refill  . amLODipine (NORVASC) 5 MG tablet Take 5 mg by mouth daily.  3  . Calcium Carb-Cholecalciferol 600-800 MG-UNIT CHEW Chew 1 tablet by mouth at bedtime.    . donepezil (ARICEPT) 10 MG tablet Take 10 mg by mouth at bedtime.  6  . metoprolol succinate (TOPROL-XL) 100 MG 24 hr tablet Take 100 mg by mouth daily.  3  . Multiple Vitamins-Minerals (CENTRUM SILVER PO) Take 1 tablet by mouth 1 day or 1 dose.    . Multiple Vitamins-Minerals (PRESERVISION AREDS) TABS Take 1 tablet by mouth  1 day or 1 dose.    . pantoprazole (PROTONIX) 40 MG tablet Take 40 mg by mouth daily.  5  . simvastatin (ZOCOR) 80 MG tablet Take 80 mg by mouth daily.  0   Current Facility-Administered Medications  Medication Dose Route Frequency Provider Last Rate Last Dose  . Influenza vac split quadrivalent PF (FLUARIX) injection 0.5 mL  0.5 mL Intramuscular Once Cammie Sickle, MD       Facility-Administered Medications Ordered in Other Visits  Medication Dose Route Frequency Provider Last Rate Last Dose  . 0.9 %  sodium chloride infusion  250 mL Intravenous Once Evlyn Kanner, NP      . acetaminophen (TYLENOL) tablet 650 mg  650 mg Oral Once Evlyn Kanner, NP      . diphenhydrAMINE (BENADRYL) capsule 25 mg  25 mg Oral Once Evlyn Kanner, NP        PHYSICAL EXAMINATION:   BP 124/59 mmHg  Pulse 66  Temp(Src) 96.9 F (36.1 C) (Tympanic)  Resp 18  Ht 5' 1.97" (1.574 m)  Wt 123 lb 7.3 oz (55.999 kg)  BMI 22.60 kg/m2  SpO2 99%  Filed Weights   09/13/15 0923  Weight: 123 lb 7.3 oz (55.999 kg)    GENERAL:alert, no distress and comfortable. Pleasant. She is accompanied by her daughter. She is able to sit on the exam table to help.  EYES:  normal, Conjunctiva are pink and non-injected, sclera clear OROPHARYNX:no exudate, no erythema and lips, buccal mucosa, and tongue normal  NECK: No thyromegaly LYMPH:  no palpable lymphadenopathy in the cervical, axillary or inguinal LUNGS: clear to auscultation with normal breathing effort; No Wheeze or crackles  Cardio-vascular-irregular Rate & Rythm and no murmurs and no lower extremity edema ABDOMEN:abdomen soft, non-tender and normal bowel sounds; No hepato-splenomegaly.  Musculoskeletal:no cyanosis of digits and no clubbing  NEURO: alert & oriented x 3 with fluent speech, no focal motor/sensory deficits. SKIN: no skin rash  BREAST EXAM: No dominant masses felt in the bilateral breasts; skin and nipple normal limits. This was done in  presence of the patient's daughter/nurse.  LABORATORY DATA:  I have reviewed the data as listed    Component Value Date/Time   NA 140 04/19/2015 0852   K 3.6 04/19/2015 0852   CL 102 04/19/2015 0852   CO2 29 04/19/2015 0852   GLUCOSE 118* 04/19/2015 0852   BUN 17 04/19/2015 0852   CREATININE 0.83 04/19/2015 0852   CALCIUM 9.3 04/19/2015 0852   PROT 6.3* 04/19/2015 0852   ALBUMIN 3.5 04/19/2015 0852   AST 30 04/19/2015 0852   ALT 26 04/19/2015 0852   ALKPHOS 71 04/19/2015 0852   BILITOT 0.4 04/19/2015 0852   GFRNONAA >60 04/19/2015 0852   GFRNONAA 50* 09/17/2014 0852   GFRAA >60 04/19/2015 0852   GFRAA >60 09/17/2014 0852    No results found for: SPEP, UPEP  Lab Results  Component Value Date   WBC 4.5 09/13/2015   NEUTROABS 3.6 09/13/2015   HGB 10.2* 09/13/2015   HCT 31.1* 09/13/2015   MCV 92.4 09/13/2015   PLT 216 09/13/2015      Chemistry      Component Value Date/Time   NA 140 04/19/2015 0852   K 3.6 04/19/2015 0852   CL 102 04/19/2015 0852   CO2 29 04/19/2015 0852   BUN 17 04/19/2015 0852   CREATININE 0.83 04/19/2015 0852      Component Value Date/Time   CALCIUM 9.3 04/19/2015 0852   ALKPHOS 71 04/19/2015 0852   AST 30 04/19/2015 0852   ALT 26 04/19/2015 0852   BILITOT 0.4 04/19/2015 0852       RADIOGRAPHIC STUDIES: I have personally reviewed the radiological images as listed and agreed with the findings in the report. No results found.   ASSESSMENT & PLAN:  Anemia, iron deficiency Anemia-multifactorial iron deficiency/question CKD vs ? undiagnosed myelodysplastic syndrome [bone marrow biopsy never done]. Patient's hemoglobin has been fairly stable currently on Procrit on a weekly basis; also Feraheme as needed.   The patient's hemoglobin is 10.2; hence I would not recommend Procrit. For now recommend continued weekly hemoglobin checks; Procrit for  hemoglobin less than 10; hematocrit less than 30.   I had a long discussion the patient's daughter  who is very involved in her care regarding the possibility of an underlying low-grade myelodysplastic syndrome; discuss the pathology /biology/clinical course of the disease that it could eventually returned to acute leukemia. Again she understands that only way to confirm the diagnosis is the bone marrow biopsy. As currently this would not change her course of therapy given her age/dementia/frailty-  i agree with Holding a bone marrow biopsy  at this time. However if she stops responding to Procrit/iron infusions bone marrow biopsy could be indicated.  This was discussed at length with the daughter who agrees with the plan.     No orders of the defined types were placed in this encounter.   All questions were answered. The patient knows to call the clinic with any  problems, questions or concerns. No barriers to learning was detected. & I spent 25 minutes counseling the patient face to face. The total time spent in the appointment was 40 minutes and more than 50% was on counseling and review of test results     Cammie Sickle, MD 09/13/2015 10:15 AM

## 2015-09-20 ENCOUNTER — Inpatient Hospital Stay: Payer: Medicare Other

## 2015-09-20 VITALS — BP 109/57 | HR 59 | Temp 96.2°F | Resp 19

## 2015-09-20 DIAGNOSIS — D509 Iron deficiency anemia, unspecified: Secondary | ICD-10-CM

## 2015-09-20 DIAGNOSIS — D638 Anemia in other chronic diseases classified elsewhere: Secondary | ICD-10-CM

## 2015-09-20 LAB — IRON AND TIBC
Iron: 28 ug/dL (ref 28–170)
Saturation Ratios: 10 % — ABNORMAL LOW (ref 10.4–31.8)
TIBC: 290 ug/dL (ref 250–450)
UIBC: 262 ug/dL

## 2015-09-20 LAB — CBC WITH DIFFERENTIAL/PLATELET
BASOS ABS: 0 10*3/uL (ref 0–0.1)
Basophils Relative: 1 %
Eosinophils Absolute: 0.1 10*3/uL (ref 0–0.7)
Eosinophils Relative: 3 %
HEMATOCRIT: 26.1 % — AB (ref 35.0–47.0)
Hemoglobin: 8.5 g/dL — ABNORMAL LOW (ref 12.0–16.0)
LYMPHS PCT: 17 %
Lymphs Abs: 0.7 10*3/uL — ABNORMAL LOW (ref 1.0–3.6)
MCH: 30.2 pg (ref 26.0–34.0)
MCHC: 32.6 g/dL (ref 32.0–36.0)
MCV: 92.9 fL (ref 80.0–100.0)
MONO ABS: 0.3 10*3/uL (ref 0.2–0.9)
Monocytes Relative: 9 %
NEUTROS ABS: 2.7 10*3/uL (ref 1.4–6.5)
Neutrophils Relative %: 70 %
Platelets: 220 10*3/uL (ref 150–440)
RBC: 2.81 MIL/uL — AB (ref 3.80–5.20)
RDW: 16.9 % — ABNORMAL HIGH (ref 11.5–14.5)
WBC: 3.9 10*3/uL (ref 3.6–11.0)

## 2015-09-20 LAB — FERRITIN: Ferritin: 47 ng/mL (ref 11–307)

## 2015-09-20 MED ORDER — EPOETIN ALFA 40000 UNIT/ML IJ SOLN
40000.0000 [IU] | INTRAMUSCULAR | Status: DC
  Administered 2015-09-20: 40000 [IU] via SUBCUTANEOUS

## 2015-09-27 ENCOUNTER — Other Ambulatory Visit: Payer: Self-pay | Admitting: Internal Medicine

## 2015-09-27 ENCOUNTER — Inpatient Hospital Stay: Payer: Medicare Other

## 2015-09-27 ENCOUNTER — Inpatient Hospital Stay: Payer: Medicare Other | Attending: Internal Medicine

## 2015-09-27 VITALS — BP 110/63 | HR 60 | Temp 96.3°F | Resp 20

## 2015-09-27 DIAGNOSIS — D638 Anemia in other chronic diseases classified elsewhere: Secondary | ICD-10-CM

## 2015-09-27 DIAGNOSIS — D509 Iron deficiency anemia, unspecified: Secondary | ICD-10-CM

## 2015-09-27 DIAGNOSIS — Z79899 Other long term (current) drug therapy: Secondary | ICD-10-CM | POA: Diagnosis not present

## 2015-09-27 DIAGNOSIS — N189 Chronic kidney disease, unspecified: Secondary | ICD-10-CM | POA: Insufficient documentation

## 2015-09-27 DIAGNOSIS — D631 Anemia in chronic kidney disease: Secondary | ICD-10-CM | POA: Diagnosis not present

## 2015-09-27 DIAGNOSIS — I129 Hypertensive chronic kidney disease with stage 1 through stage 4 chronic kidney disease, or unspecified chronic kidney disease: Secondary | ICD-10-CM | POA: Insufficient documentation

## 2015-09-27 LAB — HEMATOCRIT: HEMATOCRIT: 23.9 % — AB (ref 35.0–47.0)

## 2015-09-27 LAB — HEMOGLOBIN: Hemoglobin: 7.6 g/dL — ABNORMAL LOW (ref 12.0–16.0)

## 2015-09-27 MED ORDER — SODIUM CHLORIDE 0.9 % IV SOLN
510.0000 mg | Freq: Once | INTRAVENOUS | Status: AC
  Administered 2015-09-27: 510 mg via INTRAVENOUS
  Filled 2015-09-27: qty 17

## 2015-09-27 MED ORDER — SODIUM CHLORIDE 0.9 % IV SOLN
Freq: Once | INTRAVENOUS | Status: AC
  Administered 2015-09-27: 10:00:00 via INTRAVENOUS
  Filled 2015-09-27: qty 1000

## 2015-09-28 ENCOUNTER — Inpatient Hospital Stay: Payer: Medicare Other

## 2015-09-28 VITALS — BP 126/66 | HR 62 | Resp 20

## 2015-09-28 DIAGNOSIS — D638 Anemia in other chronic diseases classified elsewhere: Secondary | ICD-10-CM

## 2015-09-28 DIAGNOSIS — I129 Hypertensive chronic kidney disease with stage 1 through stage 4 chronic kidney disease, or unspecified chronic kidney disease: Secondary | ICD-10-CM | POA: Diagnosis not present

## 2015-09-28 DIAGNOSIS — D509 Iron deficiency anemia, unspecified: Secondary | ICD-10-CM

## 2015-09-28 MED ORDER — EPOETIN ALFA 40000 UNIT/ML IJ SOLN
40000.0000 [IU] | INTRAMUSCULAR | Status: DC
  Administered 2015-09-28: 40000 [IU] via SUBCUTANEOUS
  Filled 2015-09-28: qty 1

## 2015-10-04 ENCOUNTER — Inpatient Hospital Stay: Payer: Medicare Other

## 2015-10-04 VITALS — BP 147/71 | HR 53 | Temp 94.7°F | Resp 20

## 2015-10-04 DIAGNOSIS — D509 Iron deficiency anemia, unspecified: Secondary | ICD-10-CM

## 2015-10-04 DIAGNOSIS — D638 Anemia in other chronic diseases classified elsewhere: Secondary | ICD-10-CM

## 2015-10-04 DIAGNOSIS — I129 Hypertensive chronic kidney disease with stage 1 through stage 4 chronic kidney disease, or unspecified chronic kidney disease: Secondary | ICD-10-CM | POA: Diagnosis not present

## 2015-10-04 LAB — HEMOGLOBIN: Hemoglobin: 9.2 g/dL — ABNORMAL LOW (ref 12.0–16.0)

## 2015-10-04 LAB — HEMATOCRIT: HEMATOCRIT: 30.4 % — AB (ref 35.0–47.0)

## 2015-10-04 MED ORDER — EPOETIN ALFA 40000 UNIT/ML IJ SOLN
40000.0000 [IU] | INTRAMUSCULAR | Status: DC
  Administered 2015-10-04: 40000 [IU] via SUBCUTANEOUS

## 2015-10-11 ENCOUNTER — Inpatient Hospital Stay: Payer: Medicare Other

## 2015-10-11 VITALS — BP 105/46 | HR 66 | Temp 97.6°F | Resp 18

## 2015-10-11 DIAGNOSIS — D638 Anemia in other chronic diseases classified elsewhere: Secondary | ICD-10-CM

## 2015-10-11 DIAGNOSIS — D509 Iron deficiency anemia, unspecified: Secondary | ICD-10-CM

## 2015-10-11 DIAGNOSIS — I129 Hypertensive chronic kidney disease with stage 1 through stage 4 chronic kidney disease, or unspecified chronic kidney disease: Secondary | ICD-10-CM | POA: Diagnosis not present

## 2015-10-11 LAB — HEMOGLOBIN: HEMOGLOBIN: 9.2 g/dL — AB (ref 12.0–16.0)

## 2015-10-11 LAB — HEMATOCRIT: HCT: 29.7 % — ABNORMAL LOW (ref 35.0–47.0)

## 2015-10-11 MED ORDER — EPOETIN ALFA 40000 UNIT/ML IJ SOLN
40000.0000 [IU] | INTRAMUSCULAR | Status: DC
Start: 2015-10-11 — End: 2015-10-11
  Administered 2015-10-11: 40000 [IU] via SUBCUTANEOUS

## 2015-10-18 ENCOUNTER — Inpatient Hospital Stay: Payer: Medicare Other

## 2015-10-18 VITALS — BP 124/63 | HR 56 | Temp 96.4°F | Resp 18

## 2015-10-18 DIAGNOSIS — I129 Hypertensive chronic kidney disease with stage 1 through stage 4 chronic kidney disease, or unspecified chronic kidney disease: Secondary | ICD-10-CM | POA: Diagnosis not present

## 2015-10-18 DIAGNOSIS — D509 Iron deficiency anemia, unspecified: Secondary | ICD-10-CM

## 2015-10-18 DIAGNOSIS — D638 Anemia in other chronic diseases classified elsewhere: Secondary | ICD-10-CM

## 2015-10-18 LAB — HEMATOCRIT: HCT: 30.1 % — ABNORMAL LOW (ref 35.0–47.0)

## 2015-10-18 LAB — HEMOGLOBIN: Hemoglobin: 9.3 g/dL — ABNORMAL LOW (ref 12.0–16.0)

## 2015-10-18 MED ORDER — EPOETIN ALFA 40000 UNIT/ML IJ SOLN
40000.0000 [IU] | INTRAMUSCULAR | Status: DC
  Administered 2015-10-18: 40000 [IU] via SUBCUTANEOUS
  Filled 2015-10-18: qty 1

## 2015-10-18 NOTE — Progress Notes (Signed)
Pt here for procrit injection. Alert, talkative. No dyspnea noted. Pt denies any dyspnea. Appetite is good for sweets. Drinks Boost and [employer]. Daughter reports seeing dark, tarry stools at times.

## 2015-10-25 ENCOUNTER — Inpatient Hospital Stay: Payer: Medicare Other | Attending: Internal Medicine

## 2015-10-25 ENCOUNTER — Other Ambulatory Visit
Admission: RE | Admit: 2015-10-25 | Discharge: 2015-10-25 | Disposition: A | Discharge status: Home / Self Care | Payer: Medicare Other | Source: Ambulatory Visit | Attending: Internal Medicine | Admitting: Internal Medicine

## 2015-10-25 ENCOUNTER — Inpatient Hospital Stay: Payer: Medicare Other

## 2015-10-25 DIAGNOSIS — R0600 Dyspnea, unspecified: Secondary | ICD-10-CM | POA: Diagnosis not present

## 2015-10-25 DIAGNOSIS — M109 Gout, unspecified: Secondary | ICD-10-CM | POA: Diagnosis not present

## 2015-10-25 DIAGNOSIS — D509 Iron deficiency anemia, unspecified: Secondary | ICD-10-CM

## 2015-10-25 DIAGNOSIS — I4891 Unspecified atrial fibrillation: Secondary | ICD-10-CM | POA: Diagnosis not present

## 2015-10-25 DIAGNOSIS — F0390 Unspecified dementia without behavioral disturbance: Secondary | ICD-10-CM | POA: Insufficient documentation

## 2015-10-25 DIAGNOSIS — Z79899 Other long term (current) drug therapy: Secondary | ICD-10-CM | POA: Diagnosis not present

## 2015-10-25 DIAGNOSIS — D631 Anemia in chronic kidney disease: Secondary | ICD-10-CM | POA: Insufficient documentation

## 2015-10-25 DIAGNOSIS — I10 Essential (primary) hypertension: Secondary | ICD-10-CM | POA: Diagnosis present

## 2015-10-25 DIAGNOSIS — E119 Type 2 diabetes mellitus without complications: Secondary | ICD-10-CM | POA: Insufficient documentation

## 2015-10-25 DIAGNOSIS — E7800 Pure hypercholesterolemia, unspecified: Secondary | ICD-10-CM | POA: Insufficient documentation

## 2015-10-25 DIAGNOSIS — R062 Wheezing: Secondary | ICD-10-CM | POA: Insufficient documentation

## 2015-10-25 DIAGNOSIS — N189 Chronic kidney disease, unspecified: Secondary | ICD-10-CM | POA: Diagnosis not present

## 2015-10-25 DIAGNOSIS — I252 Old myocardial infarction: Secondary | ICD-10-CM | POA: Diagnosis not present

## 2015-10-25 DIAGNOSIS — Z87891 Personal history of nicotine dependence: Secondary | ICD-10-CM | POA: Diagnosis not present

## 2015-10-25 DIAGNOSIS — I2510 Atherosclerotic heart disease of native coronary artery without angina pectoris: Secondary | ICD-10-CM | POA: Insufficient documentation

## 2015-10-25 DIAGNOSIS — I129 Hypertensive chronic kidney disease with stage 1 through stage 4 chronic kidney disease, or unspecified chronic kidney disease: Secondary | ICD-10-CM | POA: Insufficient documentation

## 2015-10-25 LAB — BASIC METABOLIC PANEL
Anion gap: 8 (ref 5–15)
BUN: 20 mg/dL (ref 6–20)
CHLORIDE: 105 mmol/L (ref 101–111)
CO2: 29 mmol/L (ref 22–32)
CREATININE: 1 mg/dL (ref 0.44–1.00)
Calcium: 9.5 mg/dL (ref 8.9–10.3)
GFR, EST AFRICAN AMERICAN: 55 mL/min — AB (ref >60)
GFR, EST NON AFRICAN AMERICAN: 48 mL/min — AB (ref >60)
Glucose, Bld: 161 mg/dL — ABNORMAL HIGH (ref 65–99)
Potassium: 3.7 mmol/L (ref 3.5–5.1)
SODIUM: 142 mmol/L (ref 135–145)

## 2015-10-25 LAB — HEMATOCRIT: HEMATOCRIT: 34 % — AB (ref 35.0–47.0)

## 2015-10-25 LAB — HEMOGLOBIN: HEMOGLOBIN: 10.4 g/dL — AB (ref 12.0–16.0)

## 2015-11-01 ENCOUNTER — Inpatient Hospital Stay: Payer: Medicare Other

## 2015-11-01 ENCOUNTER — Inpatient Hospital Stay (HOSPITAL_BASED_OUTPATIENT_CLINIC_OR_DEPARTMENT_OTHER): Payer: Medicare Other | Admitting: Internal Medicine

## 2015-11-01 ENCOUNTER — Encounter: Payer: Self-pay | Admitting: Internal Medicine

## 2015-11-01 VITALS — BP 116/55 | HR 64 | Temp 97.7°F | Resp 24 | Ht 61.97 in | Wt 143.3 lb

## 2015-11-01 DIAGNOSIS — R0600 Dyspnea, unspecified: Secondary | ICD-10-CM | POA: Diagnosis not present

## 2015-11-01 DIAGNOSIS — R062 Wheezing: Secondary | ICD-10-CM

## 2015-11-01 DIAGNOSIS — D638 Anemia in other chronic diseases classified elsewhere: Secondary | ICD-10-CM

## 2015-11-01 DIAGNOSIS — D509 Iron deficiency anemia, unspecified: Secondary | ICD-10-CM

## 2015-11-01 DIAGNOSIS — Z79899 Other long term (current) drug therapy: Secondary | ICD-10-CM

## 2015-11-01 DIAGNOSIS — F0390 Unspecified dementia without behavioral disturbance: Secondary | ICD-10-CM | POA: Diagnosis not present

## 2015-11-01 DIAGNOSIS — I129 Hypertensive chronic kidney disease with stage 1 through stage 4 chronic kidney disease, or unspecified chronic kidney disease: Secondary | ICD-10-CM | POA: Diagnosis not present

## 2015-11-01 LAB — HEMOGLOBIN: Hemoglobin: 9.4 g/dL — ABNORMAL LOW (ref 12.0–16.0)

## 2015-11-01 LAB — HEMATOCRIT: HEMATOCRIT: 30.3 % — AB (ref 35.0–47.0)

## 2015-11-01 MED ORDER — FUROSEMIDE 20 MG PO TABS: 20.0000 mg | ORAL_TABLET | Freq: Every day | ORAL | Status: DC

## 2015-11-01 MED ORDER — PREDNISONE 20 MG PO TABS: 20.0000 mg | ORAL_TABLET | Freq: Every day | ORAL | Status: DC

## 2015-11-01 NOTE — Progress Notes (Deleted)
[facility]  Patient Care Team: Tanya Brunner, MD as PCP - General (Internal Medicine)  Anemia Presents for initial visit.    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 Unfortunately patient is a poor historian given her age/dementia; has most of the history is taken talking to her daughter.  As per the daughter, no blood in stools or black stools; no nausea no vomiting abdominal pain. She has lost some weight; because of poor appetite.  As per the daughter, patient has mild shortness of breath with exertion; however no significant shortness of breath at rest.   Patient has not had any recent colonoscopy or EGD. Patient last received a transfusion in August 2016.   Review of systems: Difficult to assess given patient's dementia.  I have reviewed the past medical history, past surgical history, social history and family history with the patient and they are unchanged from previous [facility] unless stated above.  ALLERGIES:  is allergic to penicillin g; sulfa antibiotics; ace inhibitors; and penicillin v potassium.  MEDICATIONS:  Current Outpatient Prescriptions  Medication Sig Dispense Refill  . amLODipine (NORVASC) 5 MG tablet Take 5 mg by mouth daily.  3  . Calcium Carb-Cholecalciferol 600-800 MG-UNIT CHEW Chew 1 tablet by mouth at bedtime.    . donepezil (ARICEPT) 10 MG tablet Take 10 mg by mouth at bedtime.  6  . metoprolol succinate (TOPROL-XL) 100 MG 24 hr tablet Take 100 mg by mouth daily.  3  . Multiple Vitamins-Minerals (CENTRUM SILVER PO) Take 1 tablet by mouth 1 day or 1 dose.    . Multiple Vitamins-Minerals (PRESERVISION  AREDS) TABS Take 1 tablet by mouth 2 (two) times daily.     . pantoprazole (PROTONIX) 40 MG tablet Take 40 mg by mouth daily.  5  . simvastatin (ZOCOR) 80 MG tablet Take 80 mg by mouth daily.  0   No current facility-administered medications for this visit.   Facility-Administered Medications Ordered in Other Visits  Medication Dose Route Frequency Provider Last Rate Last Dose  . 0.9 %  sodium chloride infusion  250 mL Intravenous Once Evlyn Kanner, NP      . acetaminophen (TYLENOL) tablet 650 mg  650 mg Oral Once Evlyn Kanner, NP      . diphenhydrAMINE (BENADRYL) capsule 25 mg  25 mg Oral Once Evlyn Kanner, NP        PHYSICAL EXAMINATION:   Ht 5' 1.97" (1.574 m)  Wt 143 lb 4.8 oz (65 kg)  BMI 26.24 kg/m2  Filed Weights   11/01/15 0917  Weight: 143 lb 4.8 oz (65 kg)    GENERAL:alert, no distress and comfortable. Pleasant. She is accompanied by her daughter. She is able to sit on the exam table to help.  EYES:  normal, Conjunctiva are pink and non-injected, sclera clear OROPHARYNX:no exudate, no erythema and lips, buccal mucosa, and tongue normal  NECK: No thyromegaly LYMPH:  no palpable lymphadenopathy in the cervical, axillary or inguinal LUNGS: clear to auscultation with normal breathing effort; No Wheeze or crackles  Cardio-vascular-irregular Rate & Rythm and no murmurs and no lower extremity edema ABDOMEN:abdomen soft, non-tender and normal  bowel sounds; No hepato-splenomegaly.  Musculoskeletal:no cyanosis of digits and no clubbing  NEURO: alert & oriented x 3 with fluent speech, no focal motor/sensory deficits. SKIN: no skin rash  BREAST EXAM: No dominant masses felt in the bilateral breasts; skin and nipple normal limits. This was done in presence of the patient's daughter/nurse.  LABORATORY DATA:  I have reviewed the data as listed    Component Value Date/Time   NA 142 10/25/2015 0855   NA 140 04/19/2015 0852   K 3.7 10/25/2015 0855   K 3.6 04/19/2015  0852   CL 105 10/25/2015 0855   CL 102 04/19/2015 0852   CO2 29 10/25/2015 0855   CO2 29 04/19/2015 0852   GLUCOSE 161* 10/25/2015 0855   GLUCOSE 118* 04/19/2015 0852   BUN 20 10/25/2015 0855   BUN 17 04/19/2015 0852   CREATININE 1.00 10/25/2015 0855   CREATININE 0.83 04/19/2015 0852   CALCIUM 9.5 10/25/2015 0855   CALCIUM 9.3 04/19/2015 0852   PROT 6.3* 04/19/2015 0852   ALBUMIN 3.5 04/19/2015 0852   AST 30 04/19/2015 0852   ALT 26 04/19/2015 0852   ALKPHOS 71 04/19/2015 0852   BILITOT 0.4 04/19/2015 0852   GFRNONAA 48* 10/25/2015 0855   GFRNONAA >60 04/19/2015 0852   GFRNONAA 50* 09/17/2014 0852   GFRAA 55* 10/25/2015 0855   GFRAA >60 04/19/2015 0852   GFRAA >60 09/17/2014 0852    No results found for: SPEP, UPEP  Lab Results  Component Value Date   WBC 3.9 09/20/2015   NEUTROABS 2.7 09/20/2015   HGB 9.4* 11/01/2015   HCT 30.3* 11/01/2015   MCV 92.9 09/20/2015   PLT 220 09/20/2015      Chemistry      Component Value Date/Time   NA 142 10/25/2015 0855   NA 140 04/19/2015 0852   K 3.7 10/25/2015 0855   K 3.6 04/19/2015 0852   CL 105 10/25/2015 0855   CL 102 04/19/2015 0852   CO2 29 10/25/2015 0855   CO2 29 04/19/2015 0852   BUN 20 10/25/2015 0855   BUN 17 04/19/2015 0852   CREATININE 1.00 10/25/2015 0855   CREATININE 0.83 04/19/2015 0852      Component Value Date/Time   CALCIUM 9.5 10/25/2015 0855   CALCIUM 9.3 04/19/2015 0852   ALKPHOS 71 04/19/2015 0852   AST 30 04/19/2015 0852   ALT 26 04/19/2015 0852   BILITOT 0.4 04/19/2015 0852       RADIOGRAPHIC STUDIES: I have personally reviewed the radiological images as listed and agreed with the findings in the report. No results found.   ASSESSMENT & PLAN:  No problem-specific assessment & plan notes found for this encounter.   No orders of the defined types were placed in this encounter.   All questions were answered. The patient knows to call the clinic with any problems, questions or  concerns. No barriers to learning was detected. & I spent 25 minutes counseling the patient face to face. The total time spent in the appointment was 40 minutes and more than 50% was on counseling and review of test results     Cammie Sickle, MD 11/01/2015 9:22 AM

## 2015-11-01 NOTE — Progress Notes (Signed)
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 Unfortunately patient is a poor historian given her age/dementia; has most of the history is taken talking to her daughter.  As per the daughter, patient has been wheezing. Given her dementia it is hard to delineate if patient has any worsening shortness of breath. On questioning she does admit to shortness of breath with exertion. Patient has gained 10 pounds.  As per the daughter no blood in stools or black stools; no nausea no vomiting abdominal pain.  REVIEW OF SYSTEMS:  Difficult to assess given her dementia.  PAST MEDICAL HISTORY :  Past Medical History  Diagnosis Date  . IDA (iron deficiency anemia)   . Pulmonary nodules   . Elevated carcinoembryonic antigen (CEA)   . History of atrial fibrillation   . AV malformation of gastrointestinal tract     AV malformations of the duodenum  . Coronary artery disease   . History of MI (myocardial infarction)   . Hypercholesteremia   . Osteoporosis   . History of esophageal stricture   . Anxiety   . HTN (hypertension)   . Gout   . DM (diabetes mellitus)   . Metastatic multiple myeloma to bone     Plasma cell myeloma, IgG lambda  . Glaucoma   . Osteoarthritis   . Aortic sclerosis   . Osteoarthritis   . H/O bone scan 04/27/2011    PAST SURGICAL HISTORY :   Past Surgical History  Procedure Laterality Date  . Tonsillectomy    . Thyroidectomy, partial    . Appendectomy    . Endovascular angioplasty femoral/popliteal artery w/stent/atherectomy   1990  . Upper gastrointestinal endoscopy  2015  . Colonoscopy    . Polypectomy      FAMILY HISTORY :   Family History  Problem Relation Age of Onset  . Coronary artery disease Mother   . Diabetes Mellitus II Mother   . Heart attack Father   . Heart attack Sister   . Aortic aneurysm Brother     brother #1  . Prostate cancer Brother     brother #1  . Heart attack Brother     brother #1  . Prostate cancer Brother     brother #2  . Heart attack Brother     brother #2  . Heart attack Brother     brother #3    SOCIAL HISTORY:   Social History  Substance Use Topics  . Smoking status: Former Smoker -- 0.50 packs/day for 40 years    Types: Cigarettes  . Smokeless tobacco: Never Used     Comment: quit in 1991  . Alcohol Use: 0.0 oz/week    0 Standard drinks or equivalent per week     Comment: history of alcohol use on a regular basis, but does not drink now per md    ALLERGIES:  is allergic to penicillin g; sulfa antibiotics; ace inhibitors; and penicillin v potassium.  MEDICATIONS:  Current Outpatient Prescriptions  Medication Sig Dispense Refill  . amLODipine (NORVASC) 5 MG tablet Take  5 mg by mouth daily.  3  . Calcium Carb-Cholecalciferol 600-800 MG-UNIT CHEW Chew 1 tablet by mouth at bedtime.    . donepezil (ARICEPT) 10 MG tablet Take 10 mg by mouth at bedtime.  6  . metoprolol succinate (TOPROL-XL) 100 MG 24 hr tablet Take 100 mg by mouth daily.  3  . Multiple Vitamins-Minerals (CENTRUM SILVER PO) Take 1 tablet by mouth 1 day or 1 dose.    . Multiple Vitamins-Minerals (PRESERVISION AREDS) TABS Take 1 tablet by mouth 2 (two) times daily.     . pantoprazole (PROTONIX) 40 MG tablet Take 40 mg by mouth daily.  5  . simvastatin (ZOCOR) 80 MG tablet Take 80 mg by mouth daily.  0  . furosemide (LASIX) 20 MG tablet Take 1 tablet (20 mg total) by mouth daily. 7 tablet 0  . predniSONE (DELTASONE) 20 MG tablet Take 1 tablet (20 mg total) by mouth daily with breakfast. Okay to  start today. 5 tablet 0   No current facility-administered medications for this visit.   Facility-Administered Medications Ordered in Other Visits  Medication Dose Route Frequency Provider Last Rate Last Dose  . 0.9 %  sodium chloride infusion  250 mL Intravenous Once Evlyn Kanner, NP      . acetaminophen (TYLENOL) tablet 650 mg  650 mg Oral Once Evlyn Kanner, NP      . diphenhydrAMINE (BENADRYL) capsule 25 mg  25 mg Oral Once Evlyn Kanner, NP        PHYSICAL EXAMINATION: ECOG PERFORMANCE STATUS: 1 - Symptomatic but completely ambulatory  BP 116/55 mmHg  Pulse 64  Temp(Src) 97.7 F (36.5 C) (Tympanic)  Resp 24  Ht 5' 1.97" (1.574 m)  Wt 143 lb 4.8 oz (65 kg)  BMI 26.24 kg/m2  SpO2 92%  Filed Weights   11/01/15 0917  Weight: 143 lb 4.8 oz (65 kg)    GENERAL: Well-nourished well-developed; Alert, no distress and comfortable.  She gets mildly dyspneic on exertion. She is able to sit on the exam table by herself. EYES: no pallor or icterus OROPHARYNX: no thrush or ulceration. NECK: supple, no masses felt LYMPH:  no palpable lymphadenopathy in the cervical, axillary or inguinal regions LUNGS: Diffuse wheezing noted; otherwise air entry is fair. No significant crackles noted HEART/CVS: regular rate & rhythm and positive for systolic murmur; No lower extremity edema ABDOMEN:abdomen soft, non-tender and normal bowel sounds Musculoskeletal:no cyanosis of digits and no clubbing  PSYCH: alert & oriented x 3 with fluent speech NEURO: no focal motor/sensory deficits SKIN:  no rashes or significant lesions  LABORATORY DATA:  I have reviewed the data as listed    Component Value Date/Time   NA 142 10/25/2015 0855   NA 140 04/19/2015 0852   K 3.7 10/25/2015 0855   K 3.6 04/19/2015 0852   CL 105 10/25/2015 0855   CL 102 04/19/2015 0852   CO2 29 10/25/2015 0855   CO2 29 04/19/2015 0852   GLUCOSE 161* 10/25/2015 0855   GLUCOSE 118* 04/19/2015 0852   BUN 20 10/25/2015  0855   BUN 17 04/19/2015 0852   CREATININE 1.00 10/25/2015 0855   CREATININE 0.83 04/19/2015 0852   CALCIUM 9.5 10/25/2015 0855   CALCIUM 9.3 04/19/2015 0852   PROT 6.3* 04/19/2015 0852   ALBUMIN 3.5 04/19/2015 0852   AST 30 04/19/2015 0852   ALT 26 04/19/2015 0852   ALKPHOS 71 04/19/2015 0852   BILITOT 0.4 04/19/2015 0852   GFRNONAA 48* 10/25/2015 0855  GFRNONAA >60 04/19/2015 0852   GFRNONAA 50* 09/17/2014 0852   GFRAA 55* 10/25/2015 0855   GFRAA >60 04/19/2015 0852   GFRAA >60 09/17/2014 0852    No results found for: SPEP, UPEP  Lab Results  Component Value Date   WBC 3.9 09/20/2015   NEUTROABS 2.7 09/20/2015   HGB 9.4* 11/01/2015   HCT 30.3* 11/01/2015   MCV 92.9 09/20/2015   PLT 220 09/20/2015      Chemistry      Component Value Date/Time   NA 142 10/25/2015 0855   NA 140 04/19/2015 0852   K 3.7 10/25/2015 0855   K 3.6 04/19/2015 0852   CL 105 10/25/2015 0855   CL 102 04/19/2015 0852   CO2 29 10/25/2015 0855   CO2 29 04/19/2015 0852   BUN 20 10/25/2015 0855   BUN 17 04/19/2015 0852   CREATININE 1.00 10/25/2015 0855   CREATININE 0.83 04/19/2015 0852      Component Value Date/Time   CALCIUM 9.5 10/25/2015 0855   CALCIUM 9.3 04/19/2015 0852   ALKPHOS 71 04/19/2015 0852   AST 30 04/19/2015 0852   ALT 26 04/19/2015 0852   BILITOT 0.4 04/19/2015 0852       RADIOGRAPHIC STUDIES: I have personally reviewed the radiological images as listed and agreed with the findings in the report. No results found.   ASSESSMENT & PLAN:   # Dyspnea-appears to be a combination of wheezing from bronchitis/reactive disease; small component of fluid retention/question CHF. We will start The patient on low-dose of steroids prednisone 20 mg for 5 days; Lasix 20 mg for 7 days.  # Anemia- today hematocrit is 30 hemoglobin is 9.7. Hold off Procrit injection.   Reevaluate the patient in approximately 1 week when she comes for the next Procrit injection. However in the interim  If patient's dyspnea gets worse; she will need to follow-up her PCP.     Orders Placed This Encounter  Procedures  . Basic metabolic panel    Standing Status: Future     Number of Occurrences:      Standing Expiration Date: 10/31/2016   All questions were answered. The patient knows to call the clinic with any problems, questions or concerns. No barriers to learning was detected.  I spent 15 minutes counseling the patient face to face. The total time spent in the appointment was 30 minutes and more than 50% was on counseling and review of test results     Cammie Sickle, MD 11/01/2015 9:48 AM

## 2015-11-01 NOTE — Progress Notes (Signed)
The patient presents to clinic today for her procrit injection. She is short of breath today upon exertion. Sats are at 92%. She has mild confusion today but is easily reoriented to person, place and time. She has a bronchial wheeze throughout lung fields. Patient has gained 10 lbs since September. Her daughter states that her mother has had a good appetite. She wants to eat chocolate cake and processed food cakes all day long.  Rn Spoke with Dr. Rogue Bussing. Procrit will be held. Prednisone and lasix will be called into pharmacy. PMD notified of patient's condition. Per Dr. Rogue Bussing, a metb will be added to next weeks labs.

## 2015-11-08 ENCOUNTER — Inpatient Hospital Stay: Payer: Medicare Other | Admitting: Internal Medicine

## 2015-11-08 ENCOUNTER — Inpatient Hospital Stay: Payer: Medicare Other

## 2015-11-08 VITALS — BP 122/56 | HR 58 | Temp 97.2°F | Resp 19

## 2015-11-08 DIAGNOSIS — I129 Hypertensive chronic kidney disease with stage 1 through stage 4 chronic kidney disease, or unspecified chronic kidney disease: Secondary | ICD-10-CM | POA: Diagnosis not present

## 2015-11-08 DIAGNOSIS — D638 Anemia in other chronic diseases classified elsewhere: Secondary | ICD-10-CM

## 2015-11-08 DIAGNOSIS — D509 Iron deficiency anemia, unspecified: Secondary | ICD-10-CM

## 2015-11-08 LAB — BASIC METABOLIC PANEL
ANION GAP: 7 (ref 5–15)
BUN: 28 mg/dL — ABNORMAL HIGH (ref 6–20)
CHLORIDE: 101 mmol/L (ref 101–111)
CO2: 33 mmol/L — AB (ref 22–32)
Calcium: 10 mg/dL (ref 8.9–10.3)
Creatinine, Ser: 0.87 mg/dL (ref 0.44–1.00)
GFR calc non Af Amer: 57 mL/min — ABNORMAL LOW (ref >60)
Glucose, Bld: 142 mg/dL — ABNORMAL HIGH (ref 65–99)
POTASSIUM: 3.7 mmol/L (ref 3.5–5.1)
SODIUM: 141 mmol/L (ref 135–145)

## 2015-11-08 LAB — HEMOGLOBIN: Hemoglobin: 9.8 g/dL — ABNORMAL LOW (ref 12.0–16.0)

## 2015-11-08 LAB — HEMATOCRIT: HCT: 31.9 % — ABNORMAL LOW (ref 35.0–47.0)

## 2015-11-08 MED ORDER — EPOETIN ALFA 40000 UNIT/ML IJ SOLN
40000.0000 [IU] | INTRAMUSCULAR | Status: AC
  Administered 2015-11-08: 40000 [IU] via SUBCUTANEOUS

## 2015-11-08 NOTE — Progress Notes (Signed)
Pt is here for procrit injection. Hgb is 9.8 today. Pt is much better today. Fluid Lenna Sciara is dissipated. Denies any dyspnea or wheezing. Pt isat her normal baseline. She is joking with the nurses. MD notified and todays MD visit is cancelled. Proceed with procrit injection. VSS.

## 2015-11-15 ENCOUNTER — Inpatient Hospital Stay: Payer: Medicare Other

## 2015-11-15 VITALS — BP 112/57 | HR 59 | Temp 96.3°F | Resp 19

## 2015-11-15 DIAGNOSIS — I129 Hypertensive chronic kidney disease with stage 1 through stage 4 chronic kidney disease, or unspecified chronic kidney disease: Secondary | ICD-10-CM | POA: Diagnosis not present

## 2015-11-15 DIAGNOSIS — D638 Anemia in other chronic diseases classified elsewhere: Secondary | ICD-10-CM

## 2015-11-15 DIAGNOSIS — D509 Iron deficiency anemia, unspecified: Secondary | ICD-10-CM

## 2015-11-15 LAB — HEMATOCRIT: HCT: 30.5 % — ABNORMAL LOW (ref 35.0–47.0)

## 2015-11-15 LAB — HEMOGLOBIN: Hemoglobin: 9.3 g/dL — ABNORMAL LOW (ref 12.0–16.0)

## 2015-11-15 MED ORDER — EPOETIN ALFA 40000 UNIT/ML IJ SOLN
40000.0000 [IU] | INTRAMUSCULAR | Status: DC
Start: 2015-11-15 — End: 2015-11-15
  Administered 2015-11-15: 40000 [IU] via SUBCUTANEOUS

## 2015-11-16 ENCOUNTER — Ambulatory Visit: Payer: Medicare Other

## 2015-11-16 ENCOUNTER — Other Ambulatory Visit: Payer: Medicare Other

## 2015-11-22 ENCOUNTER — Inpatient Hospital Stay: Payer: Medicare Other

## 2015-11-22 VITALS — BP 118/58 | HR 62 | Temp 97.0°F | Resp 18

## 2015-11-22 DIAGNOSIS — D509 Iron deficiency anemia, unspecified: Secondary | ICD-10-CM

## 2015-11-22 DIAGNOSIS — D638 Anemia in other chronic diseases classified elsewhere: Secondary | ICD-10-CM

## 2015-11-22 DIAGNOSIS — I129 Hypertensive chronic kidney disease with stage 1 through stage 4 chronic kidney disease, or unspecified chronic kidney disease: Secondary | ICD-10-CM | POA: Diagnosis not present

## 2015-11-22 LAB — HEMATOCRIT: HCT: 28.1 % — ABNORMAL LOW (ref 35.0–47.0)

## 2015-11-22 LAB — HEMOGLOBIN: Hemoglobin: 8.4 g/dL — ABNORMAL LOW (ref 12.0–16.0)

## 2015-11-22 MED ORDER — EPOETIN ALFA 40000 UNIT/ML IJ SOLN
40000.0000 [IU] | INTRAMUSCULAR | Status: DC
  Administered 2015-11-22: 40000 [IU] via SUBCUTANEOUS
  Filled 2015-11-22: qty 1

## 2015-11-29 ENCOUNTER — Inpatient Hospital Stay: Payer: Medicare Other

## 2015-11-29 ENCOUNTER — Inpatient Hospital Stay: Payer: Medicare Other | Attending: Internal Medicine

## 2015-11-29 ENCOUNTER — Other Ambulatory Visit: Payer: Self-pay | Admitting: *Deleted

## 2015-11-29 VITALS — BP 123/58 | HR 57 | Temp 96.4°F | Resp 20

## 2015-11-29 DIAGNOSIS — R0600 Dyspnea, unspecified: Secondary | ICD-10-CM | POA: Diagnosis not present

## 2015-11-29 DIAGNOSIS — Z79899 Other long term (current) drug therapy: Secondary | ICD-10-CM | POA: Insufficient documentation

## 2015-11-29 DIAGNOSIS — D638 Anemia in other chronic diseases classified elsewhere: Secondary | ICD-10-CM

## 2015-11-29 DIAGNOSIS — D509 Iron deficiency anemia, unspecified: Secondary | ICD-10-CM

## 2015-11-29 DIAGNOSIS — N189 Chronic kidney disease, unspecified: Secondary | ICD-10-CM | POA: Diagnosis not present

## 2015-11-29 DIAGNOSIS — D631 Anemia in chronic kidney disease: Secondary | ICD-10-CM | POA: Diagnosis not present

## 2015-11-29 LAB — HEMOGLOBIN: HEMOGLOBIN: 8.5 g/dL — AB (ref 12.0–16.0)

## 2015-11-29 LAB — HEMATOCRIT: HCT: 28 % — ABNORMAL LOW (ref 35.0–47.0)

## 2015-11-29 MED ORDER — EPOETIN ALFA 40000 UNIT/ML IJ SOLN
40000.0000 [IU] | INTRAMUSCULAR | Status: DC
  Administered 2015-11-29: 40000 [IU] via SUBCUTANEOUS

## 2015-11-29 NOTE — Progress Notes (Signed)
Patient evaluated by RN today in clinic. Per family, pt has been c/o dizziness and generalized fatigue when rising up and down from chair. Vitals stable in clinic today. Family requested IV iron for patient. MD notified of patient request. Md states that patient can have a ferritin level checked today. However, patient declined being "stuck again" today and requested that these labs be added to next week's labs. Patient unable to get procrit/iron IV on same day due to insurance. Patient and patient's family expressed satisfaction in care plan.

## 2015-12-06 ENCOUNTER — Other Ambulatory Visit: Payer: Self-pay | Admitting: Internal Medicine

## 2015-12-06 ENCOUNTER — Inpatient Hospital Stay: Payer: Medicare Other

## 2015-12-06 VITALS — BP 141/64 | HR 60 | Temp 96.8°F | Resp 20

## 2015-12-06 DIAGNOSIS — D638 Anemia in other chronic diseases classified elsewhere: Secondary | ICD-10-CM

## 2015-12-06 DIAGNOSIS — D509 Iron deficiency anemia, unspecified: Secondary | ICD-10-CM

## 2015-12-06 DIAGNOSIS — N189 Chronic kidney disease, unspecified: Secondary | ICD-10-CM | POA: Diagnosis not present

## 2015-12-06 LAB — FERRITIN: FERRITIN: 11 ng/mL (ref 11–307)

## 2015-12-06 LAB — HEMATOCRIT: HEMATOCRIT: 28.8 % — AB (ref 35.0–47.0)

## 2015-12-06 LAB — HEMOGLOBIN: HEMOGLOBIN: 8.4 g/dL — AB (ref 12.0–16.0)

## 2015-12-06 MED ORDER — EPOETIN ALFA 40000 UNIT/ML IJ SOLN
40000.0000 [IU] | INTRAMUSCULAR | Status: DC
  Administered 2015-12-06: 40000 [IU] via SUBCUTANEOUS

## 2015-12-08 ENCOUNTER — Inpatient Hospital Stay: Payer: Medicare Other

## 2015-12-08 VITALS — BP 125/68 | HR 56 | Temp 96.4°F | Resp 20

## 2015-12-08 DIAGNOSIS — N189 Chronic kidney disease, unspecified: Secondary | ICD-10-CM | POA: Diagnosis not present

## 2015-12-08 DIAGNOSIS — D509 Iron deficiency anemia, unspecified: Secondary | ICD-10-CM

## 2015-12-08 DIAGNOSIS — D638 Anemia in other chronic diseases classified elsewhere: Secondary | ICD-10-CM

## 2015-12-08 MED ORDER — SODIUM CHLORIDE 0.9 % IV SOLN
Freq: Once | INTRAVENOUS | Status: AC
  Administered 2015-12-08: 10:00:00 via INTRAVENOUS
  Filled 2015-12-08: qty 1000

## 2015-12-08 MED ORDER — FERUMOXYTOL INJECTION 510 MG/17 ML
510.0000 mg | Freq: Once | INTRAVENOUS | Status: AC
  Administered 2015-12-08: 510 mg via INTRAVENOUS
  Filled 2015-12-08: qty 17

## 2015-12-13 ENCOUNTER — Inpatient Hospital Stay: Payer: Medicare Other

## 2015-12-13 VITALS — BP 125/59 | HR 82 | Temp 95.8°F | Resp 20

## 2015-12-13 DIAGNOSIS — N189 Chronic kidney disease, unspecified: Secondary | ICD-10-CM | POA: Diagnosis not present

## 2015-12-13 DIAGNOSIS — D638 Anemia in other chronic diseases classified elsewhere: Secondary | ICD-10-CM

## 2015-12-13 DIAGNOSIS — D509 Iron deficiency anemia, unspecified: Secondary | ICD-10-CM

## 2015-12-13 LAB — HEMOGLOBIN: Hemoglobin: 9.1 g/dL — ABNORMAL LOW (ref 12.0–16.0)

## 2015-12-13 LAB — HEMATOCRIT: HCT: 31.3 % — ABNORMAL LOW (ref 35.0–47.0)

## 2015-12-13 MED ORDER — EPOETIN ALFA 40000 UNIT/ML IJ SOLN
40000.0000 [IU] | INTRAMUSCULAR | Status: DC
Start: 2015-12-13 — End: 2015-12-13
  Administered 2015-12-13: 40000 [IU] via SUBCUTANEOUS
  Filled 2015-12-13: qty 1

## 2015-12-20 ENCOUNTER — Inpatient Hospital Stay: Payer: Medicare Other

## 2015-12-20 DIAGNOSIS — N189 Chronic kidney disease, unspecified: Secondary | ICD-10-CM | POA: Diagnosis not present

## 2015-12-20 DIAGNOSIS — D638 Anemia in other chronic diseases classified elsewhere: Secondary | ICD-10-CM

## 2015-12-20 LAB — HEMOGLOBIN: Hemoglobin: 10.8 g/dL — ABNORMAL LOW (ref 12.0–16.0)

## 2015-12-20 LAB — HEMATOCRIT: HCT: 36.5 % (ref 35.0–47.0)

## 2015-12-27 ENCOUNTER — Inpatient Hospital Stay: Payer: Medicare Other | Attending: Internal Medicine

## 2015-12-27 ENCOUNTER — Inpatient Hospital Stay: Payer: Medicare Other

## 2015-12-27 DIAGNOSIS — I2510 Atherosclerotic heart disease of native coronary artery without angina pectoris: Secondary | ICD-10-CM | POA: Insufficient documentation

## 2015-12-27 DIAGNOSIS — R634 Abnormal weight loss: Secondary | ICD-10-CM | POA: Diagnosis not present

## 2015-12-27 DIAGNOSIS — Z87891 Personal history of nicotine dependence: Secondary | ICD-10-CM | POA: Insufficient documentation

## 2015-12-27 DIAGNOSIS — D631 Anemia in chronic kidney disease: Secondary | ICD-10-CM | POA: Insufficient documentation

## 2015-12-27 DIAGNOSIS — Z79899 Other long term (current) drug therapy: Secondary | ICD-10-CM | POA: Insufficient documentation

## 2015-12-27 DIAGNOSIS — I252 Old myocardial infarction: Secondary | ICD-10-CM | POA: Insufficient documentation

## 2015-12-27 DIAGNOSIS — D638 Anemia in other chronic diseases classified elsewhere: Secondary | ICD-10-CM

## 2015-12-27 DIAGNOSIS — E7800 Pure hypercholesterolemia, unspecified: Secondary | ICD-10-CM | POA: Diagnosis not present

## 2015-12-27 DIAGNOSIS — E119 Type 2 diabetes mellitus without complications: Secondary | ICD-10-CM | POA: Diagnosis not present

## 2015-12-27 DIAGNOSIS — Q2733 Arteriovenous malformation of digestive system vessel: Secondary | ICD-10-CM | POA: Diagnosis not present

## 2015-12-27 DIAGNOSIS — N189 Chronic kidney disease, unspecified: Secondary | ICD-10-CM | POA: Diagnosis not present

## 2015-12-27 DIAGNOSIS — I129 Hypertensive chronic kidney disease with stage 1 through stage 4 chronic kidney disease, or unspecified chronic kidney disease: Secondary | ICD-10-CM | POA: Diagnosis present

## 2015-12-27 LAB — HEMOGLOBIN: HEMOGLOBIN: 10.6 g/dL — AB (ref 12.0–16.0)

## 2015-12-27 LAB — HEMATOCRIT: HEMATOCRIT: 35.4 % (ref 35.0–47.0)

## 2016-01-03 ENCOUNTER — Inpatient Hospital Stay: Payer: Medicare Other

## 2016-01-03 ENCOUNTER — Inpatient Hospital Stay (HOSPITAL_BASED_OUTPATIENT_CLINIC_OR_DEPARTMENT_OTHER): Payer: Medicare Other | Admitting: Internal Medicine

## 2016-01-03 VITALS — BP 128/61 | HR 59 | Temp 96.0°F | Resp 20 | Wt 122.1 lb

## 2016-01-03 DIAGNOSIS — Z79899 Other long term (current) drug therapy: Secondary | ICD-10-CM

## 2016-01-03 DIAGNOSIS — D638 Anemia in other chronic diseases classified elsewhere: Secondary | ICD-10-CM

## 2016-01-03 DIAGNOSIS — D509 Iron deficiency anemia, unspecified: Secondary | ICD-10-CM | POA: Diagnosis not present

## 2016-01-03 DIAGNOSIS — I129 Hypertensive chronic kidney disease with stage 1 through stage 4 chronic kidney disease, or unspecified chronic kidney disease: Secondary | ICD-10-CM | POA: Diagnosis not present

## 2016-01-03 DIAGNOSIS — R634 Abnormal weight loss: Secondary | ICD-10-CM | POA: Diagnosis not present

## 2016-01-03 LAB — CBC WITH DIFFERENTIAL/PLATELET
BASOS ABS: 0.1 10*3/uL (ref 0–0.1)
Basophils Relative: 2 %
EOS ABS: 0.1 10*3/uL (ref 0–0.7)
EOS PCT: 3 %
HCT: 36.2 % (ref 35.0–47.0)
Hemoglobin: 11.2 g/dL — ABNORMAL LOW (ref 12.0–16.0)
LYMPHS PCT: 23 %
Lymphs Abs: 0.7 10*3/uL — ABNORMAL LOW (ref 1.0–3.6)
MCH: 24.2 pg — ABNORMAL LOW (ref 26.0–34.0)
MCHC: 31 g/dL — ABNORMAL LOW (ref 32.0–36.0)
MCV: 78.3 fL — ABNORMAL LOW (ref 80.0–100.0)
Monocytes Absolute: 0.2 10*3/uL (ref 0.2–0.9)
Monocytes Relative: 8 %
Neutro Abs: 2 10*3/uL (ref 1.4–6.5)
Neutrophils Relative %: 64 %
PLATELETS: 205 10*3/uL (ref 150–440)
RBC: 4.62 MIL/uL (ref 3.80–5.20)
RDW: 26.5 % — ABNORMAL HIGH (ref 11.5–14.5)
WBC: 3.1 10*3/uL — AB (ref 3.6–11.0)

## 2016-01-03 LAB — COMPREHENSIVE METABOLIC PANEL
ALT: 20 U/L (ref 14–54)
AST: 24 U/L (ref 15–41)
Albumin: 3.9 g/dL (ref 3.5–5.0)
Alkaline Phosphatase: 63 U/L (ref 38–126)
Anion gap: 6 (ref 5–15)
BUN: 15 mg/dL (ref 6–20)
CHLORIDE: 104 mmol/L (ref 101–111)
CO2: 29 mmol/L (ref 22–32)
CREATININE: 0.73 mg/dL (ref 0.44–1.00)
Calcium: 9.5 mg/dL (ref 8.9–10.3)
GFR calc Af Amer: >60 mL/min (ref >60)
GFR calc non Af Amer: >60 mL/min (ref >60)
Glucose, Bld: 116 mg/dL — ABNORMAL HIGH (ref 65–99)
Potassium: 3.6 mmol/L (ref 3.5–5.1)
SODIUM: 139 mmol/L (ref 135–145)
Total Bilirubin: 0.6 mg/dL (ref 0.3–1.2)
Total Protein: 6.4 g/dL — ABNORMAL LOW (ref 6.5–8.1)

## 2016-01-03 NOTE — Progress Notes (Signed)
[facility] OFFICE PROGRESS NOTE  Patient Care Team: Madelyn Brunner, MD as PCP - General (Internal Medicine)   SUMMARY OF ONCOLOGIC HISTORY:  # 2009- Anemia- IRON DEF sec to duodenal AV Malformation [s/p cautery 2013; s/p Colo] s/p Ferraheme; CKD on PROCRIT q W  # Moderate Dementia/ Hx A.fib/ AS   INTERVAL HISTORY: A very pleasant [name] patient with long-standing history of anemia and also moderate dementia is here for follow-up of her anemia. Unfortunately patient is a poor historian given her age/dementia; has most of the history is taken talking to her daughter.  As per the daughter, patient has been doing well. Denies any pain. Does get she short of breath on exertion.  As per the daughter no blood in stools or black stools; no nausea no vomiting abdominal pain. Patient is a poor appetite.-Lost about 20 pounds in the last 3 months.  REVIEW OF SYSTEMS:  Difficult to assess given her dementia.  PAST MEDICAL HISTORY :  Past Medical History  Diagnosis Date  . IDA (iron deficiency anemia)   . Pulmonary nodules   . Elevated carcinoembryonic antigen (CEA)   . History of atrial fibrillation   . AV malformation of gastrointestinal tract     AV malformations of the duodenum  . Coronary artery disease   . History of MI (myocardial infarction)   . Hypercholesteremia   . Osteoporosis   . History of esophageal stricture   . Anxiety   . HTN (hypertension)   . Gout   . DM (diabetes mellitus) ([place])   . Metastatic multiple myeloma to bone (HCC)     Plasma cell myeloma, IgG lambda  . Glaucoma   . Osteoarthritis   . Aortic sclerosis ([place])   . Osteoarthritis   . H/O bone scan 04/27/2011    PAST SURGICAL HISTORY :   Past Surgical History  Procedure Laterality Date  . Tonsillectomy    . Thyroidectomy, partial    . Appendectomy    . Endovascular angioplasty femoral/popliteal artery w/stent/atherectomy  1990  . Upper gastrointestinal  endoscopy  2015  . Colonoscopy    . Polypectomy      FAMILY HISTORY :   Family History  Problem Relation Age of Onset  . Coronary artery disease Mother   . Diabetes Mellitus II Mother   . Heart attack Father   . Heart attack Sister   . Aortic aneurysm Brother     brother #1  . Prostate cancer Brother     brother #1  . Heart attack Brother     brother #1  . Prostate cancer Brother     brother #2  . Heart attack Brother     brother #2  . Heart attack Brother     brother #3    SOCIAL HISTORY:   Social History  Substance Use Topics  . Smoking status: Former Smoker -- 0.50 packs/day for 40 years    Types: Cigarettes  . Smokeless tobacco: Never Used     Comment: quit in 1991  . Alcohol Use: 0.0 oz/week    0 Standard drinks or equivalent per week     Comment: history of alcohol use on a regular basis, but does not drink now per md    ALLERGIES:  is allergic to penicillin g; sulfa antibiotics; ace inhibitors; and penicillin v potassium.  MEDICATIONS:  Current Outpatient Prescriptions  Medication Sig Dispense Refill  . amLODipine (NORVASC) 5 MG tablet Take 5 mg by mouth  daily.  3  . Calcium Carb-Cholecalciferol 600-800 MG-UNIT CHEW Chew 1 tablet by mouth at bedtime.    . donepezil (ARICEPT) 10 MG tablet Take 10 mg by mouth at bedtime.  6  . furosemide (LASIX) 20 MG tablet Take 1 tablet (20 mg total) by mouth daily. 7 tablet 0  . metoprolol succinate (TOPROL-XL) 100 MG 24 hr tablet Take 100 mg by mouth daily.  3  . Multiple Vitamins-Minerals (CENTRUM SILVER PO) Take 1 tablet by mouth 1 day or 1 dose.    . Multiple Vitamins-Minerals (PRESERVISION AREDS) TABS Take 1 tablet by mouth 2 (two) times daily.     . pantoprazole (PROTONIX) 40 MG tablet Take 40 mg by mouth daily.  5  . predniSONE (DELTASONE) 20 MG tablet Take 1 tablet (20 mg total) by mouth daily with breakfast. Okay to start today. 5 tablet 0  . simvastatin (ZOCOR) 80 MG tablet Take 80 mg by mouth daily.  0   No  current facility-administered medications for this visit.   Facility-Administered Medications Ordered in Other Visits  Medication Dose Route Frequency Provider Last Rate Last Dose  . 0.9 %  sodium chloride infusion  250 mL Intravenous Once Evlyn Kanner, NP      . acetaminophen (TYLENOL) tablet 650 mg  650 mg Oral Once Evlyn Kanner, NP      . diphenhydrAMINE (BENADRYL) capsule 25 mg  25 mg Oral Once Evlyn Kanner, NP      . epoetin alfa (EPOGEN,PROCRIT) injection 40,000 Units  40,000 Units Subcutaneous Weekly Cammie Sickle, MD   40,000 Units at 11/08/15 0940    PHYSICAL EXAMINATION: ECOG PERFORMANCE STATUS: 1 - Symptomatic but completely ambulatory  BP 128/61 mmHg  Pulse 59  Temp(Src) 96 F (35.6 C) (Tympanic)  Wt 122 lb 2.2 oz (55.4 kg)  Filed Weights   01/03/16 0911  Weight: 122 lb 2.2 oz (55.4 kg)    GENERAL: Well-nourished well-developed; Alert, no distress and comfortable.   She is able to sit on the exam table by herself. Accompanied by her daughter. EYES: no pallor or icterus OROPHARYNX: no thrush or ulceration. NECK: supple, no masses felt LYMPH:  no palpable lymphadenopathy in the cervical, axillary or inguinal regions LUNGS: Clear to auscultation bilaterally. No significant crackles noted HEART/CVS: regular rate & rhythm and positive for systolic murmur; No lower extremity edema ABDOMEN:abdomen soft, non-tender and normal bowel sounds Musculoskeletal:no cyanosis of digits and no clubbing  PSYCH: alert & oriented x 3 with fluent speech NEURO: no focal motor/sensory deficits SKIN:  no rashes or significant lesions  LABORATORY DATA:  I have reviewed the data as listed    Component Value Date/Time   NA 139 01/03/2016 0856   NA 140 04/19/2015 0852   K 3.6 01/03/2016 0856   K 3.6 04/19/2015 0852   CL 104 01/03/2016 0856   CL 102 04/19/2015 0852   CO2 29 01/03/2016 0856   CO2 29 04/19/2015 0852   GLUCOSE 116* 01/03/2016 0856   GLUCOSE 118*  04/19/2015 0852   BUN 15 01/03/2016 0856   BUN 17 04/19/2015 0852   CREATININE 0.73 01/03/2016 0856   CREATININE 0.83 04/19/2015 0852   CALCIUM 9.5 01/03/2016 0856   CALCIUM 9.3 04/19/2015 0852   PROT 6.4* 01/03/2016 0856   PROT 6.3* 04/19/2015 0852   ALBUMIN 3.9 01/03/2016 0856   ALBUMIN 3.5 04/19/2015 0852   AST 24 01/03/2016 0856   AST 30 04/19/2015 0852   ALT 20 01/03/2016 0856  ALT 26 04/19/2015 0852   ALKPHOS 63 01/03/2016 0856   ALKPHOS 71 04/19/2015 0852   BILITOT 0.6 01/03/2016 0856   BILITOT 0.4 04/19/2015 0852   GFRNONAA >60 01/03/2016 0856   GFRNONAA >60 04/19/2015 0852   GFRNONAA 50* 09/17/2014 0852   GFRAA >60 01/03/2016 0856   GFRAA >60 04/19/2015 0852   GFRAA >60 09/17/2014 0852    No results found for: SPEP, UPEP  Lab Results  Component Value Date   WBC 3.1* 01/03/2016   NEUTROABS 2.0 01/03/2016   HGB 11.2* 01/03/2016   HCT 36.2 01/03/2016   MCV 78.3* 01/03/2016   PLT 205 01/03/2016      Chemistry      Component Value Date/Time   NA 139 01/03/2016 0856   NA 140 04/19/2015 0852   K 3.6 01/03/2016 0856   K 3.6 04/19/2015 0852   CL 104 01/03/2016 0856   CL 102 04/19/2015 0852   CO2 29 01/03/2016 0856   CO2 29 04/19/2015 0852   BUN 15 01/03/2016 0856   BUN 17 04/19/2015 0852   CREATININE 0.73 01/03/2016 0856   CREATININE 0.83 04/19/2015 0852      Component Value Date/Time   CALCIUM 9.5 01/03/2016 0856   CALCIUM 9.3 04/19/2015 0852   ALKPHOS 63 01/03/2016 0856   ALKPHOS 71 04/19/2015 0852   AST 24 01/03/2016 0856   AST 30 04/19/2015 0852   ALT 20 01/03/2016 0856   ALT 26 04/19/2015 0852   BILITOT 0.6 01/03/2016 0856   BILITOT 0.4 04/19/2015 0852       RADIOGRAPHIC STUDIES: I have personally reviewed the radiological images as listed and agreed with the findings in the report. No results found.   ASSESSMENT & PLAN:   # Anemia- MDS versus other causes currently on Procrit 40,000 units on a weekly basis. Today hemoglobin is 11.2.  Hold off Procrit.  # Iron deficiency secondary to Procrit- status post IVIG infusion [dec 20th 2016]- with significant improvement in the hemoglobin levels.   # Weight loss of 20 pounds in the last 3 months- question secondary to loss of fluid [patient's previous weight in September was around 120 pounds/question baseline weight]. Patient will get rechecked when she comes in for Procrit; if dropping further; then recommend CT imaging.  All questions were answered. The patient knows to call the clinic with any problems, questions or concerns. No barriers to learning was detected.  # 15 minutes face-to-face with the patient discussing the above plan of care; more than 50% of time spent on prognosis/ natural history; counseling and coordination.     Cammie Sickle, MD 01/03/2016 9:19 AM

## 2016-01-10 ENCOUNTER — Inpatient Hospital Stay: Payer: Medicare Other

## 2016-01-10 DIAGNOSIS — D638 Anemia in other chronic diseases classified elsewhere: Secondary | ICD-10-CM

## 2016-01-10 DIAGNOSIS — I129 Hypertensive chronic kidney disease with stage 1 through stage 4 chronic kidney disease, or unspecified chronic kidney disease: Secondary | ICD-10-CM | POA: Diagnosis not present

## 2016-01-10 LAB — HEMOGLOBIN: Hemoglobin: 10.3 g/dL — ABNORMAL LOW (ref 12.0–16.0)

## 2016-01-10 LAB — HEMATOCRIT: HCT: 32.8 % — ABNORMAL LOW (ref 35.0–47.0)

## 2016-01-17 ENCOUNTER — Inpatient Hospital Stay: Payer: Medicare Other

## 2016-01-17 VITALS — BP 103/65 | HR 78 | Temp 96.8°F | Resp 20

## 2016-01-17 DIAGNOSIS — D638 Anemia in other chronic diseases classified elsewhere: Secondary | ICD-10-CM

## 2016-01-17 DIAGNOSIS — I129 Hypertensive chronic kidney disease with stage 1 through stage 4 chronic kidney disease, or unspecified chronic kidney disease: Secondary | ICD-10-CM | POA: Diagnosis not present

## 2016-01-17 DIAGNOSIS — D509 Iron deficiency anemia, unspecified: Secondary | ICD-10-CM

## 2016-01-17 LAB — HEMOGLOBIN: HEMOGLOBIN: 8.7 g/dL — AB (ref 12.0–16.0)

## 2016-01-17 LAB — HEMATOCRIT: HEMATOCRIT: 27.7 % — AB (ref 35.0–47.0)

## 2016-01-17 MED ORDER — EPOETIN ALFA 40000 UNIT/ML IJ SOLN
40000.0000 [IU] | INTRAMUSCULAR | Status: DC
  Administered 2016-01-17: 40000 [IU] via SUBCUTANEOUS

## 2016-01-24 ENCOUNTER — Inpatient Hospital Stay: Payer: Medicare Other

## 2016-01-24 ENCOUNTER — Other Ambulatory Visit: Payer: Self-pay | Admitting: Internal Medicine

## 2016-01-24 VITALS — BP 136/69 | HR 61 | Temp 97.1°F | Resp 20 | Wt 124.0 lb

## 2016-01-24 DIAGNOSIS — D638 Anemia in other chronic diseases classified elsewhere: Secondary | ICD-10-CM

## 2016-01-24 DIAGNOSIS — I129 Hypertensive chronic kidney disease with stage 1 through stage 4 chronic kidney disease, or unspecified chronic kidney disease: Secondary | ICD-10-CM | POA: Diagnosis not present

## 2016-01-24 DIAGNOSIS — D509 Iron deficiency anemia, unspecified: Secondary | ICD-10-CM

## 2016-01-24 LAB — HEMOGLOBIN: HEMOGLOBIN: 7.9 g/dL — AB (ref 12.0–16.0)

## 2016-01-24 LAB — HEMATOCRIT: HEMATOCRIT: 25.7 % — AB (ref 35.0–47.0)

## 2016-01-24 MED ORDER — EPOETIN ALFA 40000 UNIT/ML IJ SOLN
40000.0000 [IU] | INTRAMUSCULAR | Status: DC
  Administered 2016-01-24: 40000 [IU] via SUBCUTANEOUS

## 2016-01-24 NOTE — Progress Notes (Signed)
Pt here for her procrit injection. Hgb has dropped to 7.9 . MD notified. Pt denies weakness or dizziness. No dyspnea noted. Plan to have pt come back this week for feraheme infusion.

## 2016-01-26 ENCOUNTER — Telehealth: Payer: Self-pay | Admitting: Internal Medicine

## 2016-01-26 ENCOUNTER — Ambulatory Visit
Admission: EM | Admit: 2016-01-26 | Discharge: 2016-01-26 | Disposition: A | Discharge status: Home / Self Care | Payer: Medicare Other | Attending: Family Medicine | Admitting: Family Medicine

## 2016-01-26 ENCOUNTER — Ambulatory Visit: Payer: Medicare Other

## 2016-01-26 DIAGNOSIS — I252 Old myocardial infarction: Secondary | ICD-10-CM | POA: Insufficient documentation

## 2016-01-26 DIAGNOSIS — E119 Type 2 diabetes mellitus without complications: Secondary | ICD-10-CM | POA: Insufficient documentation

## 2016-01-26 DIAGNOSIS — C9000 Multiple myeloma not having achieved remission: Secondary | ICD-10-CM | POA: Diagnosis not present

## 2016-01-26 DIAGNOSIS — S2242XA Multiple fractures of ribs, left side, initial encounter for closed fracture: Secondary | ICD-10-CM | POA: Insufficient documentation

## 2016-01-26 DIAGNOSIS — I2510 Atherosclerotic heart disease of native coronary artery without angina pectoris: Secondary | ICD-10-CM | POA: Insufficient documentation

## 2016-01-26 DIAGNOSIS — W19XXXA Unspecified fall, initial encounter: Secondary | ICD-10-CM | POA: Diagnosis not present

## 2016-01-26 DIAGNOSIS — R0781 Pleurodynia: Secondary | ICD-10-CM | POA: Diagnosis present

## 2016-01-26 DIAGNOSIS — I10 Essential (primary) hypertension: Secondary | ICD-10-CM | POA: Diagnosis not present

## 2016-01-26 DIAGNOSIS — F0390 Unspecified dementia without behavioral disturbance: Secondary | ICD-10-CM | POA: Insufficient documentation

## 2016-01-26 DIAGNOSIS — Z880 Allergy status to penicillin: Secondary | ICD-10-CM | POA: Diagnosis not present

## 2016-01-26 DIAGNOSIS — Z87891 Personal history of nicotine dependence: Secondary | ICD-10-CM | POA: Diagnosis not present

## 2016-01-26 DIAGNOSIS — S2232XA Fracture of one rib, left side, initial encounter for closed fracture: Secondary | ICD-10-CM

## 2016-01-26 DIAGNOSIS — D509 Iron deficiency anemia, unspecified: Secondary | ICD-10-CM | POA: Diagnosis not present

## 2016-01-26 HISTORY — DX: Unspecified dementia, unspecified severity, without behavioral disturbance, psychotic disturbance, mood disturbance, and anxiety: F03.90

## 2016-01-26 MED ORDER — TRAMADOL HCL 50 MG PO TABS: 50.0000 mg | ORAL_TABLET | Freq: Four times a day (QID) | ORAL | Status: DC | PRN

## 2016-01-26 NOTE — Telephone Encounter (Signed)
Patient's dtr Donia Guiles called to inquire about her lab results. Said patient is still complaining chest hurts and she did fall on Sunday so they think it might have been from that. But chest still hurting and they are taking her to see Dr. Lisette Grinder today because she has complained to several people besides her daughters about the pain which is unusual for her.

## 2016-01-26 NOTE — Telephone Encounter (Signed)
Called and left msg for patient's daughter. Call returned. These concerns need to be addressed with the patient's primary care provider. We are only treating her for her anemia. One of her daughters accompanied the patient with her appointment on Tuesday. The labs were discussed at that time with the daughter and the patient and the daughter were informed about the labs at that time. Her hgb was 7.9. We explained on Tuesday that she will need IV iron.  Md recommended that the iron be scheduled on Wednesday; however The daughter that was present elected to schedule the the iron infusion on Friday. Md did not want to transfuse the patient.  I will personally be glad to discuss the daughter's concerns and review the lab results with the daughter. I asked Ginny to call the cancer center back at her convenience.

## 2016-01-26 NOTE — ED Provider Notes (Signed)
CSN: [id number]     Arrival date & time 01/26/16  1235 History   First MD Initiated Contact with Patient 01/26/16 1411     Chief Complaint  Patient presents with  . Fall   (Consider location/radiation/quality/duration/timing/severity/associated sxs/prior Treatment) HPI   82 year old female with dementia who is accompanied by her granddaughter and daughter.. Is complaining of left sided rib pain just inferior to her axilla. Evidently several days ago she had fallen while trying to get into her other daughter's car and landed on the concrete. She not had any confusion or loss of consciousness that has been reported. At that time she began complaining of left-sided rib pain that has continued to worsen. She has numerous comorbidities but the most recent is that of iron deficiency anemia for which she receives iron injections and transfusions. She is scheduled for a transfusion tomorrow at the cancer center in [facility] [facility]. She has not having any blood or sputum and is not complaining of shortness of breath.  Past Medical History  Diagnosis Date  . IDA (iron deficiency anemia)   . Pulmonary nodules   . Elevated carcinoembryonic antigen (CEA)   . History of atrial fibrillation   . AV malformation of gastrointestinal tract     AV malformations of the duodenum  . Coronary artery disease   . History of MI (myocardial infarction)   . Hypercholesteremia   . Osteoporosis   . History of esophageal stricture   . Anxiety   . HTN (hypertension)   . Gout   . DM (diabetes mellitus) ([place])   . Metastatic multiple myeloma to bone (HCC)     Plasma cell myeloma, IgG lambda  . Glaucoma   . Osteoarthritis   . Aortic sclerosis ([place])   . Osteoarthritis   . H/O bone scan 04/27/2011  . Dementia    Past Surgical History  Procedure Laterality Date  . Tonsillectomy    . Thyroidectomy, partial    . Appendectomy    . Endovascular angioplasty femoral/popliteal artery w/stent/atherectomy  1990  . Upper  gastrointestinal endoscopy  2015  . Colonoscopy    . Polypectomy     Family History  Problem Relation Age of Onset  . Coronary artery disease Mother   . Diabetes Mellitus II Mother   . Heart attack Father   . Heart attack Sister   . Aortic aneurysm Brother     brother #1  . Prostate cancer Brother     brother #1  . Heart attack Brother     brother #1  . Prostate cancer Brother     brother #2  . Heart attack Brother     brother #2  . Heart attack Brother     brother #3   Social History  Substance Use Topics  . Smoking status: Former Smoker -- 0.50 packs/day for 40 years    Types: Cigarettes  . Smokeless tobacco: Never Used     Comment: quit in 1991  . Alcohol Use: No     Comment: previous ETOH abuse-stopped 3 years ago   OB History    No data available     Review of Systems  Allergies  Penicillin g; Sulfa antibiotics; Ace inhibitors; and Penicillin v potassium  Home Medications   Prior to Admission medications   Medication Sig Start Date End Date Taking? Authorizing Provider  amLODipine (NORVASC) 5 MG tablet Take 5 mg by mouth daily. 02/08/15  Yes Historical Provider, MD  Calcium Carb-Cholecalciferol 600-800 MG-UNIT CHEW Chew 1 tablet by  mouth at bedtime.   Yes Historical Provider, MD  donepezil (ARICEPT) 10 MG tablet Take 10 mg by mouth at bedtime. 02/07/15  Yes Historical Provider, MD  metoprolol succinate (TOPROL-XL) 100 MG 24 hr tablet Take 100 mg by mouth daily. 03/14/15  Yes Historical Provider, MD  Multiple Vitamins-Minerals (CENTRUM SILVER PO) Take 1 tablet by mouth 1 day or 1 dose.   Yes Historical Provider, MD  Multiple Vitamins-Minerals (PRESERVISION AREDS) TABS Take 1 tablet by mouth 2 (two) times daily.    Yes Historical Provider, MD  pantoprazole (PROTONIX) 40 MG tablet Take 40 mg by mouth daily. 03/07/15  Yes Historical Provider, MD  simvastatin (ZOCOR) 80 MG tablet Take 80 mg by mouth daily. 03/15/15  Yes Historical Provider, MD  furosemide (LASIX) 20 MG  tablet Take 1 tablet (20 mg total) by mouth daily. 11/01/15   Cammie Sickle, MD  predniSONE (DELTASONE) 20 MG tablet Take 1 tablet (20 mg total) by mouth daily with breakfast. Okay to start today. 11/01/15   Cammie Sickle, MD  traMADol (ULTRAM) 50 MG tablet Take 1 tablet (50 mg total) by mouth every 6 (six) hours as needed. 01/26/16   Lorin Picket, PA-C   Meds Ordered and Administered this Visit  Medications - No data to display  BP 125/45 mmHg  Pulse 62  Temp(Src) 96.9 F (36.1 C) (Tympanic)  Resp 16  Ht _0  (1.549 m)  Wt 130 lb (58.968 kg)  BMI 24.58 kg/m2  SpO2 97% No data found.   Physical Exam  ED Course  Procedures (including critical care time)  Labs Review Labs Reviewed - No data to display  Imaging Review Dg Ribs Unilateral W/chest Left  01/26/2016  CLINICAL DATA:  Status post trip and fall at home 01/22/2016 with continued left chest pain. Initial encounter. EXAM: LEFT RIBS AND CHEST - 3+ VIEW COMPARISON:  Single view of the chest 03/05/2012. PA and lateral chest 02/22/2012. FINDINGS: There small bilateral pleural effusions and mild basilar atelectasis. No pneumothorax. Heart size is enlarged. Atherosclerosis is noted. The patient has nondisplaced fractures of the left fifth and sixth ribs. IMPRESSION: Acute nondisplaced fractures left fifth and sixth ribs. Negative for pneumothorax. Small bilateral pleural effusions and basilar atelectasis. Cardiomegaly without edema. Atherosclerosis. Electronically Signed   By: Inge Rise M.D.   On: 01/26/2016 14:32     Visual Acuity Review  Right Eye Distance:   Left Eye Distance:   Bilateral Distance:    Right Eye Near:   Left Eye Near:    Bilateral Near:         MDM   1. Fracture, ribs, left, closed, initial encounter    Discharge Medication List as of 01/26/2016  2:53 PM    START taking these medications   Details  traMADol (ULTRAM) 50 MG tablet Take 1 tablet (50 mg total) by mouth every 6  (six) hours as needed., Starting 01/26/2016, Until Discontinued, Print      Plan: 1. Test/x-ray results and diagnosis reviewed with patient 2. rx as per orders; risks, benefits, potential side effects reviewed with patient 3. Recommend supportive treatment with cough and deep breathing. Patient does live alone with dementia visited twice daily by her daughters. Recommended they keep a closer eye on her during the next few days to weeks to ensure that she doesn't fall again. I've given her them a few tramadol for severe pain particularly at nighttime but they must realize that she could become disoriented and confused or dizzy  while taking the medication and some should be with her if they decide to give her the medication otherwise I have recommended that they just give her Tylenol for the pain which would probably be much safer for her. She has any change with increased shortness of breath hemoptysis or increased pain that she go medially to the emergency department. 4. F/u with PCP.  Lorin Picket, PA-C 01/26/16 1459  Lorin Picket, PA-C 01/26/16 1501

## 2016-01-26 NOTE — ED Notes (Signed)
Sunday apparently fell landing on left side. C/o left rib pain since. Hx of dementia.

## 2016-01-26 NOTE — Discharge Instructions (Signed)

## 2016-01-26 NOTE — ED Notes (Signed)
Pulse Ox of 16% entered in error-16 was breaths per minute respiratory. Correct O2 sat 97% on assessment

## 2016-01-27 ENCOUNTER — Inpatient Hospital Stay: Payer: Medicare Other | Attending: Internal Medicine

## 2016-01-27 VITALS — BP 108/69 | HR 66 | Resp 18

## 2016-01-27 DIAGNOSIS — D638 Anemia in other chronic diseases classified elsewhere: Secondary | ICD-10-CM

## 2016-01-27 DIAGNOSIS — N189 Chronic kidney disease, unspecified: Secondary | ICD-10-CM | POA: Diagnosis not present

## 2016-01-27 DIAGNOSIS — D631 Anemia in chronic kidney disease: Secondary | ICD-10-CM | POA: Diagnosis not present

## 2016-01-27 DIAGNOSIS — D509 Iron deficiency anemia, unspecified: Secondary | ICD-10-CM

## 2016-01-27 MED ORDER — SODIUM CHLORIDE 0.9 % IV SOLN
INTRAVENOUS | Status: DC
  Administered 2016-01-27: 10:00:00 via INTRAVENOUS
  Filled 2016-01-27: qty 1000

## 2016-01-27 MED ORDER — FERUMOXYTOL INJECTION 510 MG/17 ML
510.0000 mg | Freq: Once | INTRAVENOUS | Status: AC
  Administered 2016-01-27: 510 mg via INTRAVENOUS
  Filled 2016-01-27: qty 17

## 2016-01-30 ENCOUNTER — Other Ambulatory Visit: Payer: Self-pay | Admitting: *Deleted

## 2016-01-30 ENCOUNTER — Telehealth: Payer: Self-pay | Admitting: *Deleted

## 2016-01-30 DIAGNOSIS — D638 Anemia in other chronic diseases classified elsewhere: Secondary | ICD-10-CM

## 2016-01-30 NOTE — Telephone Encounter (Signed)
Ginny called at 4:10PM to [facility] stating," I think Mom is going to need whole blood tomm instead of Feraheme. Her delirium is worse." Ginny wanted to expedite the process and have patient get the blood tomm. I explained it is a 24 hr process to get a blood transfusion in [place] as we are a satellite facility. I told her we will just have to wait and see what her hgb is tomm am and Dr Rogue Bussing will have to advise with Treatment. Renita Papa RN and Dr Rogue Bussing were made aware of this per phone conversation at 4:20 PM today.

## 2016-01-31 ENCOUNTER — Inpatient Hospital Stay: Payer: Medicare Other

## 2016-01-31 VITALS — BP 117/62 | Temp 98.0°F

## 2016-01-31 DIAGNOSIS — N189 Chronic kidney disease, unspecified: Secondary | ICD-10-CM | POA: Diagnosis not present

## 2016-01-31 DIAGNOSIS — D638 Anemia in other chronic diseases classified elsewhere: Secondary | ICD-10-CM

## 2016-01-31 DIAGNOSIS — D509 Iron deficiency anemia, unspecified: Secondary | ICD-10-CM

## 2016-01-31 LAB — HEMATOCRIT: HEMATOCRIT: 28.3 % — AB (ref 35.0–47.0)

## 2016-01-31 LAB — HEMOGLOBIN: Hemoglobin: 8.6 g/dL — ABNORMAL LOW (ref 12.0–16.0)

## 2016-01-31 LAB — SAMPLE TO BLOOD BANK

## 2016-01-31 MED ORDER — EPOETIN ALFA 40000 UNIT/ML IJ SOLN
40000.0000 [IU] | INTRAMUSCULAR | Status: DC
  Administered 2016-01-31: 40000 [IU] via SUBCUTANEOUS
  Filled 2016-01-31: qty 1

## 2016-02-07 ENCOUNTER — Inpatient Hospital Stay: Payer: Medicare Other

## 2016-02-07 DIAGNOSIS — N189 Chronic kidney disease, unspecified: Secondary | ICD-10-CM | POA: Diagnosis not present

## 2016-02-07 DIAGNOSIS — D638 Anemia in other chronic diseases classified elsewhere: Secondary | ICD-10-CM

## 2016-02-07 LAB — HEMOGLOBIN: HEMOGLOBIN: 10.7 g/dL — AB (ref 12.0–16.0)

## 2016-02-07 LAB — HEMATOCRIT: HCT: 35.4 % (ref 35.0–47.0)

## 2016-02-14 ENCOUNTER — Inpatient Hospital Stay: Payer: Medicare Other

## 2016-02-14 DIAGNOSIS — D638 Anemia in other chronic diseases classified elsewhere: Secondary | ICD-10-CM

## 2016-02-14 DIAGNOSIS — N189 Chronic kidney disease, unspecified: Secondary | ICD-10-CM | POA: Diagnosis not present

## 2016-02-14 LAB — HEMATOCRIT: HCT: 39.2 % (ref 35.0–47.0)

## 2016-02-14 LAB — HEMOGLOBIN: HEMOGLOBIN: 12 g/dL (ref 12.0–16.0)

## 2016-02-21 ENCOUNTER — Inpatient Hospital Stay: Payer: Medicare Other

## 2016-02-22 ENCOUNTER — Inpatient Hospital Stay: Payer: Medicare Other | Attending: Internal Medicine

## 2016-02-22 ENCOUNTER — Inpatient Hospital Stay: Payer: Medicare Other

## 2016-02-22 DIAGNOSIS — I129 Hypertensive chronic kidney disease with stage 1 through stage 4 chronic kidney disease, or unspecified chronic kidney disease: Secondary | ICD-10-CM | POA: Diagnosis not present

## 2016-02-22 DIAGNOSIS — M1990 Unspecified osteoarthritis, unspecified site: Secondary | ICD-10-CM | POA: Insufficient documentation

## 2016-02-22 DIAGNOSIS — I2510 Atherosclerotic heart disease of native coronary artery without angina pectoris: Secondary | ICD-10-CM | POA: Insufficient documentation

## 2016-02-22 DIAGNOSIS — D631 Anemia in chronic kidney disease: Secondary | ICD-10-CM | POA: Diagnosis not present

## 2016-02-22 DIAGNOSIS — Z79899 Other long term (current) drug therapy: Secondary | ICD-10-CM | POA: Diagnosis not present

## 2016-02-22 DIAGNOSIS — I4891 Unspecified atrial fibrillation: Secondary | ICD-10-CM | POA: Insufficient documentation

## 2016-02-22 DIAGNOSIS — E119 Type 2 diabetes mellitus without complications: Secondary | ICD-10-CM | POA: Diagnosis not present

## 2016-02-22 DIAGNOSIS — N189 Chronic kidney disease, unspecified: Secondary | ICD-10-CM | POA: Diagnosis not present

## 2016-02-22 DIAGNOSIS — I252 Old myocardial infarction: Secondary | ICD-10-CM | POA: Insufficient documentation

## 2016-02-22 DIAGNOSIS — I10 Essential (primary) hypertension: Secondary | ICD-10-CM | POA: Insufficient documentation

## 2016-02-22 DIAGNOSIS — M818 Other osteoporosis without current pathological fracture: Secondary | ICD-10-CM | POA: Diagnosis not present

## 2016-02-22 DIAGNOSIS — R0602 Shortness of breath: Secondary | ICD-10-CM | POA: Diagnosis not present

## 2016-02-22 DIAGNOSIS — D638 Anemia in other chronic diseases classified elsewhere: Secondary | ICD-10-CM

## 2016-02-22 LAB — HEMOGLOBIN: HEMOGLOBIN: 11.9 g/dL — AB (ref 12.0–16.0)

## 2016-02-22 LAB — HEMATOCRIT: HEMATOCRIT: 38.4 % (ref 35.0–47.0)

## 2016-02-22 NOTE — Progress Notes (Signed)
Pt here for possible procrit today. Hgb is 11.9. She does not need injection. Pt looks flushed today. Daughter reports pt has been throwing foods back up lately. She had her esophagus stretched in the past. Daughter is going to call her PCP today. Pt has lost weight some of it was fluid but not all of it. Todays weight is 116lbs.

## 2016-02-28 ENCOUNTER — Inpatient Hospital Stay: Payer: Medicare Other

## 2016-02-28 ENCOUNTER — Other Ambulatory Visit: Payer: Self-pay | Admitting: *Deleted

## 2016-02-28 DIAGNOSIS — D638 Anemia in other chronic diseases classified elsewhere: Secondary | ICD-10-CM

## 2016-02-28 DIAGNOSIS — I129 Hypertensive chronic kidney disease with stage 1 through stage 4 chronic kidney disease, or unspecified chronic kidney disease: Secondary | ICD-10-CM | POA: Diagnosis not present

## 2016-02-28 LAB — HEMATOCRIT: HEMATOCRIT: 36.1 % (ref 35.0–47.0)

## 2016-02-28 LAB — HEMOGLOBIN: HEMOGLOBIN: 11.4 g/dL — AB (ref 12.0–16.0)

## 2016-03-06 ENCOUNTER — Inpatient Hospital Stay: Payer: Medicare Other

## 2016-03-06 ENCOUNTER — Ambulatory Visit: Payer: Medicare Other | Admitting: Internal Medicine

## 2016-03-06 DIAGNOSIS — I129 Hypertensive chronic kidney disease with stage 1 through stage 4 chronic kidney disease, or unspecified chronic kidney disease: Secondary | ICD-10-CM | POA: Diagnosis not present

## 2016-03-06 DIAGNOSIS — D638 Anemia in other chronic diseases classified elsewhere: Secondary | ICD-10-CM

## 2016-03-06 LAB — HEMOGLOBIN: HEMOGLOBIN: 10.5 g/dL — AB (ref 12.0–16.0)

## 2016-03-13 ENCOUNTER — Encounter: Payer: Self-pay | Admitting: Internal Medicine

## 2016-03-13 ENCOUNTER — Ambulatory Visit: Payer: Medicare Other

## 2016-03-13 ENCOUNTER — Inpatient Hospital Stay (HOSPITAL_BASED_OUTPATIENT_CLINIC_OR_DEPARTMENT_OTHER): Payer: Medicare Other | Admitting: Internal Medicine

## 2016-03-13 ENCOUNTER — Inpatient Hospital Stay: Payer: Medicare Other

## 2016-03-13 VITALS — BP 124/72 | HR 67 | Temp 97.8°F | Resp 18 | Ht 61.0 in | Wt 119.7 lb

## 2016-03-13 DIAGNOSIS — D509 Iron deficiency anemia, unspecified: Secondary | ICD-10-CM

## 2016-03-13 DIAGNOSIS — R0602 Shortness of breath: Secondary | ICD-10-CM | POA: Diagnosis not present

## 2016-03-13 DIAGNOSIS — D638 Anemia in other chronic diseases classified elsewhere: Secondary | ICD-10-CM

## 2016-03-13 DIAGNOSIS — Z79899 Other long term (current) drug therapy: Secondary | ICD-10-CM

## 2016-03-13 DIAGNOSIS — I129 Hypertensive chronic kidney disease with stage 1 through stage 4 chronic kidney disease, or unspecified chronic kidney disease: Secondary | ICD-10-CM | POA: Diagnosis not present

## 2016-03-13 LAB — CBC WITH DIFFERENTIAL/PLATELET
BASOS ABS: 0.1 10*3/uL (ref 0–0.1)
BASOS PCT: 1 %
EOS PCT: 2 %
Eosinophils Absolute: 0.1 10*3/uL (ref 0–0.7)
HCT: 25.9 % — ABNORMAL LOW (ref 35.0–47.0)
Hemoglobin: 7.9 g/dL — ABNORMAL LOW (ref 12.0–16.0)
Lymphocytes Relative: 13 %
Lymphs Abs: 0.6 10*3/uL — ABNORMAL LOW (ref 1.0–3.6)
MCH: 26.1 pg (ref 26.0–34.0)
MCHC: 30.6 g/dL — ABNORMAL LOW (ref 32.0–36.0)
MCV: 85.1 fL (ref 80.0–100.0)
MONO ABS: 0.4 10*3/uL (ref 0.2–0.9)
Monocytes Relative: 7 %
Neutro Abs: 3.8 10*3/uL (ref 1.4–6.5)
Neutrophils Relative %: 77 %
Platelets: 265 10*3/uL (ref 150–440)
RBC: 3.04 MIL/uL — AB (ref 3.80–5.20)
RDW: 20 % — AB (ref 11.5–14.5)
WBC: 5 10*3/uL (ref 3.6–11.0)

## 2016-03-13 LAB — COMPREHENSIVE METABOLIC PANEL
ALBUMIN: 3.3 g/dL — AB (ref 3.5–5.0)
ALK PHOS: 73 U/L (ref 38–126)
ALT: 41 U/L (ref 14–54)
ANION GAP: 4 — AB (ref 5–15)
AST: 38 U/L (ref 15–41)
BILIRUBIN TOTAL: 0.2 mg/dL — AB (ref 0.3–1.2)
BUN: 22 mg/dL — ABNORMAL HIGH (ref 6–20)
CALCIUM: 9 mg/dL (ref 8.9–10.3)
CO2: 27 mmol/L (ref 22–32)
CREATININE: 0.8 mg/dL (ref 0.44–1.00)
Chloride: 108 mmol/L (ref 101–111)
GFR calc Af Amer: >60 mL/min (ref >60)
GFR calc non Af Amer: >60 mL/min (ref >60)
GLUCOSE: 147 mg/dL — AB (ref 65–99)
Potassium: 4 mmol/L (ref 3.5–5.1)
Sodium: 139 mmol/L (ref 135–145)
TOTAL PROTEIN: 5.9 g/dL — AB (ref 6.5–8.1)

## 2016-03-13 LAB — IRON AND TIBC
Iron: 36 ug/dL (ref 28–170)
Saturation Ratios: 12 % (ref 10.4–31.8)
TIBC: 304 ug/dL (ref 250–450)
UIBC: 268 ug/dL

## 2016-03-13 LAB — FERRITIN: Ferritin: 22 ng/mL (ref 11–307)

## 2016-03-13 MED ORDER — EPOETIN ALFA 40000 UNIT/ML IJ SOLN
40000.0000 [IU] | INTRAMUSCULAR | Status: DC
  Administered 2016-03-13: 40000 [IU] via SUBCUTANEOUS
  Filled 2016-03-13: qty 1

## 2016-03-13 NOTE — Progress Notes (Signed)
Pt here for anemia. Her daughter feels like she is not eating good. Her and her sister brings meals to her house but she picks at it.  The daughter states that she knows as you get older that your sense of taste is not good. Memory problems are ongoing and today in clinic she is having a hard time why she has to see md today. Her daughter and myself said it is just a check up, nothing is wrong.

## 2016-03-13 NOTE — Progress Notes (Signed)
[facility] OFFICE PROGRESS NOTE  Patient Care Team: Tanya Mussel III, MD as PCP - General (Internal Medicine)   SUMMARY OF ONCOLOGIC HISTORY:  # 2009- Anemia- IRON DEF sec to duodenal AV Malformation [s/p cautery 2013; s/p Colo] s/p Ferraheme; CKD on PROCRIT q W  # Moderate Dementia/ Hx A.fib/ AS   INTERVAL HISTORY: Poor historian/dementia; most of the history is taken talking to her daughter.  A very pleasant [name] patient with long-standing history of anemia and also moderate dementia is here for follow-up of her anemia.   As per the daughter, patient noted to have worsening shortness of breath especially with exertion. She had a recent fall while trying to climb into the car; broke her ribs. Denies any pain.   As per the daughter no blood in stools or black stools; no nausea no vomiting abdominal pain. Patient is a poor appetite.-Lost about 10 pounds in the last 3 months. Patient's chronic difficulty swallowing for which she had EGD and dilatation the past.  REVIEW OF SYSTEMS:  Difficult to assess given her dementia.  PAST MEDICAL HISTORY :  Past Medical History  Diagnosis Date  . IDA (iron deficiency anemia)   . Pulmonary nodules   . Elevated carcinoembryonic antigen (CEA)   . History of atrial fibrillation   . AV malformation of gastrointestinal tract     AV malformations of the duodenum  . Coronary artery disease   . History of MI (myocardial infarction)   . Hypercholesteremia   . Osteoporosis   . History of esophageal stricture   . Anxiety   . HTN (hypertension)   . DM (diabetes mellitus) ([place])   . Glaucoma   . Osteoarthritis   . Aortic sclerosis ([place])   . Osteoarthritis   . H/O bone scan 04/27/2011  . Dementia   . Stroke [facility])     PAST SURGICAL HISTORY :   Past Surgical History  Procedure Laterality Date  . Tonsillectomy    . Thyroidectomy, partial    . Appendectomy    . Endovascular angioplasty femoral/popliteal  artery w/stent/atherectomy  1990  . Upper gastrointestinal endoscopy  2015  . Colonoscopy    . Polypectomy      FAMILY HISTORY :   Family History  Problem Relation Age of Onset  . Coronary artery disease Mother   . Diabetes Mellitus II Mother   . Heart attack Father   . Heart attack Sister   . Aortic aneurysm Sister   . Prostate cancer Brother     brother #1  . Heart attack Brother     brother #1  . Prostate cancer Brother     brother #2  . Heart attack Brother     brother #2  . Heart attack Brother     brother #3    SOCIAL HISTORY:   Social History  Substance Use Topics  . Smoking status: Former Smoker -- 0.50 packs/day for 40 years    Types: Cigarettes  . Smokeless tobacco: Never Used     Comment: quit in 1991  . Alcohol Use: No     Comment: previous ETOH abuse-stopped 3 years ago    ALLERGIES:  is allergic to penicillin g; sulfa antibiotics; ace inhibitors; and penicillin v potassium.  MEDICATIONS:  Current Outpatient Prescriptions  Medication Sig Dispense Refill  . amLODipine (NORVASC) 5 MG tablet Take 5 mg by mouth daily.  3  . Calcium Carb-Cholecalciferol 600-800 MG-UNIT CHEW Chew 1 tablet by mouth at  bedtime.    . donepezil (ARICEPT) 10 MG tablet Take 10 mg by mouth at bedtime.  6  . metoprolol succinate (TOPROL-XL) 100 MG 24 hr tablet Take 100 mg by mouth daily.  3  . Multiple Vitamins-Minerals (CENTRUM SILVER PO) Take 1 tablet by mouth 1 day or 1 dose.    . Multiple Vitamins-Minerals (PRESERVISION AREDS) TABS Take 1 tablet by mouth 2 (two) times daily.     . pantoprazole (PROTONIX) 40 MG tablet Take 40 mg by mouth daily.  5  . simvastatin (ZOCOR) 80 MG tablet Take 80 mg by mouth daily.  0  . traMADol (ULTRAM) 50 MG tablet Take 1 tablet (50 mg total) by mouth every 6 (six) hours as needed. 15 tablet 0   No current facility-administered medications for this visit.   Facility-Administered Medications Ordered in Other Visits  Medication Dose Route Frequency  Provider Last Rate Last Dose  . 0.9 %  sodium chloride infusion  250 mL Intravenous Once Tanya Kanner, NP      . acetaminophen (TYLENOL) tablet 650 mg  650 mg Oral Once Tanya Kanner, NP      . diphenhydrAMINE (BENADRYL) capsule 25 mg  25 mg Oral Once Tanya Kanner, NP        PHYSICAL EXAMINATION: ECOG PERFORMANCE STATUS: 1 - Symptomatic but completely ambulatory  BP 124/72 mmHg  Pulse 67  Temp(Src) 97.8 F (36.6 C) (Tympanic)  Resp 18  Ht 5\' 1"  (1.549 m)  Wt 119 lb 11.4 oz (54.3 kg)  BMI 22.63 kg/m2  Filed Weights   03/13/16 0915  Weight: 119 lb 11.4 oz (54.3 kg)    GENERAL: Well-nourished well-developed; Alert, no distress and comfortable.   Accompanied by her daughter. EYES: positive for pallor OROPHARYNX: no thrush or ulceration. NECK: supple, no masses felt LYMPH:  no palpable lymphadenopathy in the cervical, axillary or inguinal regions LUNGS: Clear to auscultation bilaterally. No significant crackles noted HEART/CVS: regular rate & rhythm and positive for systolic murmur; No lower extremity edema ABDOMEN:abdomen soft, non-tender and normal bowel sounds Musculoskeletal:no cyanosis of digits and no clubbing  PSYCH: alert & oriented x 3 with fluent speech NEURO: no focal motor/sensory deficits SKIN:  no rashes or significant lesions  LABORATORY DATA:  I have reviewed the data as listed    Component Value Date/Time   NA 139 01/03/2016 0856   NA 140 04/19/2015 0852   K 3.6 01/03/2016 0856   K 3.6 04/19/2015 0852   CL 104 01/03/2016 0856   CL 102 04/19/2015 0852   CO2 29 01/03/2016 0856   CO2 29 04/19/2015 0852   GLUCOSE 116* 01/03/2016 0856   GLUCOSE 118* 04/19/2015 0852   BUN 15 01/03/2016 0856   BUN 17 04/19/2015 0852   CREATININE 0.73 01/03/2016 0856   CREATININE 0.83 04/19/2015 0852   CALCIUM 9.5 01/03/2016 0856   CALCIUM 9.3 04/19/2015 0852   PROT 6.4* 01/03/2016 0856   PROT 6.3* 04/19/2015 0852   ALBUMIN 3.9 01/03/2016 0856   ALBUMIN 3.5  04/19/2015 0852   AST 24 01/03/2016 0856   AST 30 04/19/2015 0852   ALT 20 01/03/2016 0856   ALT 26 04/19/2015 0852   ALKPHOS 63 01/03/2016 0856   ALKPHOS 71 04/19/2015 0852   BILITOT 0.6 01/03/2016 0856   BILITOT 0.4 04/19/2015 0852   GFRNONAA >60 01/03/2016 0856   GFRNONAA >60 04/19/2015 0852   GFRNONAA 50* 09/17/2014 0852   GFRAA >60 01/03/2016 0856   GFRAA >60 04/19/2015 [id number]  GFRAA >60 09/17/2014 0852    No results found for: SPEP, UPEP  Lab Results  Component Value Date   WBC 5.0 03/13/2016   NEUTROABS 3.8 03/13/2016   HGB 7.9* 03/13/2016   HCT 25.9* 03/13/2016   MCV 85.1 03/13/2016   PLT 265 03/13/2016      Chemistry      Component Value Date/Time   NA 139 01/03/2016 0856   NA 140 04/19/2015 0852   K 3.6 01/03/2016 0856   K 3.6 04/19/2015 0852   CL 104 01/03/2016 0856   CL 102 04/19/2015 0852   CO2 29 01/03/2016 0856   CO2 29 04/19/2015 0852   BUN 15 01/03/2016 0856   BUN 17 04/19/2015 0852   CREATININE 0.73 01/03/2016 0856   CREATININE 0.83 04/19/2015 0852      Component Value Date/Time   CALCIUM 9.5 01/03/2016 0856   CALCIUM 9.3 04/19/2015 0852   ALKPHOS 63 01/03/2016 0856   ALKPHOS 71 04/19/2015 0852   AST 24 01/03/2016 0856   AST 30 04/19/2015 0852   ALT 20 01/03/2016 0856   ALT 26 04/19/2015 0852   BILITOT 0.6 01/03/2016 0856   BILITOT 0.4 04/19/2015 0852        ASSESSMENT & PLAN:   # Anemia- MDS versus other causes currently on Procrit 40,000 units on a weekly basis. Today hemoglobin is 7.9. Proceed with procrit.   # Iron deficiency secondary to Procrit- Recheck iron levels today. If low we will order Feraheme.  # Weekly H&H/Procrit; follow-up with me in 3 months with a CBC CMP and iron studies.  # 15 minutes face-to-face with the patient discussing the above plan of care; more than 50% of time spent on prognosis/ natural history; counseling and coordination.     Tanya Sickle, MD 03/13/2016 9:27 AM

## 2016-03-14 ENCOUNTER — Other Ambulatory Visit: Payer: Self-pay | Admitting: Internal Medicine

## 2016-03-15 ENCOUNTER — Telehealth: Payer: Self-pay | Admitting: *Deleted

## 2016-03-15 NOTE — Telephone Encounter (Signed)
Attempted to contact pt. Pt/caregiver unable to take phone call. RN was asked to call her back in 10-15 minutes.

## 2016-03-15 NOTE — Telephone Encounter (Signed)
-----   Message from Cammie Sickle, MD sent at 03/14/2016 12:47 PM EDT ----- Patient needs IV iron; she has Feraheme ordered/she could get this next week.

## 2016-03-15 NOTE — Telephone Encounter (Signed)
RN Contacted caregiver, [employer] operational [employer]. Explained that patient will need IV iron next Tuesday when patient comes for lab. Daughter states that pt is fatigued and short of breath and she prefers that this IV iron be scheduled today or tomorrow.  I spoke with Hoy Register, RN in infusion, who can accommodate pt for IV iron tomorrow.  Dr. B aware and agreed to have patient come tomorrow.

## 2016-03-15 NOTE — Telephone Encounter (Signed)
msg sent to scheduling to arrange. 

## 2016-03-16 ENCOUNTER — Inpatient Hospital Stay: Payer: Medicare Other

## 2016-03-16 VITALS — BP 124/61 | HR 73 | Temp 96.2°F | Resp 18

## 2016-03-16 DIAGNOSIS — I129 Hypertensive chronic kidney disease with stage 1 through stage 4 chronic kidney disease, or unspecified chronic kidney disease: Secondary | ICD-10-CM | POA: Diagnosis not present

## 2016-03-16 DIAGNOSIS — D638 Anemia in other chronic diseases classified elsewhere: Secondary | ICD-10-CM

## 2016-03-16 DIAGNOSIS — D509 Iron deficiency anemia, unspecified: Secondary | ICD-10-CM

## 2016-03-16 MED ORDER — SODIUM CHLORIDE 0.9 % IV SOLN
510.0000 mg | Freq: Once | INTRAVENOUS | Status: AC
  Administered 2016-03-16: 510 mg via INTRAVENOUS
  Filled 2016-03-16: qty 17

## 2016-03-19 ENCOUNTER — Telehealth: Payer: Self-pay | Admitting: Internal Medicine

## 2016-03-19 NOTE — Telephone Encounter (Signed)
RN Noted msg from scheduling. msg fwd to Dr. Rogue Bussing.

## 2016-03-19 NOTE — Telephone Encounter (Signed)
Patient's hgb dropped so low over weekend she had mild heart attack and had emergency transfusions. Currently inpatient at [facility]. Cancelled appt for tomorrow due to being hospitalized now.

## 2016-03-20 ENCOUNTER — Inpatient Hospital Stay: Payer: Medicare Other

## 2016-03-27 ENCOUNTER — Inpatient Hospital Stay: Payer: Medicare Other | Attending: Internal Medicine

## 2016-03-27 ENCOUNTER — Inpatient Hospital Stay: Payer: Medicare Other

## 2016-03-27 DIAGNOSIS — D638 Anemia in other chronic diseases classified elsewhere: Secondary | ICD-10-CM

## 2016-03-27 DIAGNOSIS — D509 Iron deficiency anemia, unspecified: Secondary | ICD-10-CM | POA: Diagnosis present

## 2016-03-27 LAB — SAMPLE TO BLOOD BANK

## 2016-03-27 LAB — HEMOGLOBIN: Hemoglobin: 12.2 g/dL (ref 12.0–16.0)

## 2016-04-03 ENCOUNTER — Inpatient Hospital Stay: Payer: Medicare Other

## 2016-04-03 DIAGNOSIS — D509 Iron deficiency anemia, unspecified: Secondary | ICD-10-CM | POA: Diagnosis not present

## 2016-04-03 DIAGNOSIS — D638 Anemia in other chronic diseases classified elsewhere: Secondary | ICD-10-CM

## 2016-04-03 LAB — HEMOGLOBIN: Hemoglobin: 12.2 g/dL (ref 12.0–16.0)

## 2016-04-03 LAB — SAMPLE TO BLOOD BANK

## 2016-04-10 ENCOUNTER — Inpatient Hospital Stay: Payer: Medicare Other

## 2016-04-10 DIAGNOSIS — D509 Iron deficiency anemia, unspecified: Secondary | ICD-10-CM | POA: Diagnosis not present

## 2016-04-10 DIAGNOSIS — D638 Anemia in other chronic diseases classified elsewhere: Secondary | ICD-10-CM

## 2016-04-10 LAB — SAMPLE TO BLOOD BANK

## 2016-04-10 LAB — HEMOGLOBIN: Hemoglobin: 12.4 g/dL (ref 12.0–16.0)

## 2016-04-17 ENCOUNTER — Inpatient Hospital Stay: Payer: Medicare Other

## 2016-04-17 DIAGNOSIS — D638 Anemia in other chronic diseases classified elsewhere: Secondary | ICD-10-CM

## 2016-04-17 DIAGNOSIS — D509 Iron deficiency anemia, unspecified: Secondary | ICD-10-CM

## 2016-04-17 LAB — SAMPLE TO BLOOD BANK

## 2016-04-17 LAB — HEMOGLOBIN: HEMOGLOBIN: 12.9 g/dL (ref 12.0–16.0)

## 2016-04-23 ENCOUNTER — Other Ambulatory Visit: Payer: Self-pay | Admitting: Nurse Practitioner

## 2016-04-23 DIAGNOSIS — R1319 Other dysphagia: Secondary | ICD-10-CM

## 2016-04-24 ENCOUNTER — Inpatient Hospital Stay: Payer: Medicare Other

## 2016-04-24 ENCOUNTER — Inpatient Hospital Stay: Payer: Medicare Other | Attending: Internal Medicine

## 2016-04-24 DIAGNOSIS — D638 Anemia in other chronic diseases classified elsewhere: Secondary | ICD-10-CM

## 2016-04-24 DIAGNOSIS — D631 Anemia in chronic kidney disease: Secondary | ICD-10-CM | POA: Diagnosis not present

## 2016-04-24 DIAGNOSIS — D509 Iron deficiency anemia, unspecified: Secondary | ICD-10-CM

## 2016-04-24 DIAGNOSIS — Z79899 Other long term (current) drug therapy: Secondary | ICD-10-CM | POA: Insufficient documentation

## 2016-04-24 DIAGNOSIS — N189 Chronic kidney disease, unspecified: Secondary | ICD-10-CM | POA: Diagnosis not present

## 2016-04-24 LAB — HEMOGLOBIN: Hemoglobin: 11.2 g/dL — ABNORMAL LOW (ref 12.0–16.0)

## 2016-04-24 LAB — SAMPLE TO BLOOD BANK

## 2016-05-01 ENCOUNTER — Inpatient Hospital Stay: Payer: Medicare Other

## 2016-05-01 ENCOUNTER — Other Ambulatory Visit: Payer: Self-pay | Admitting: Internal Medicine

## 2016-05-01 DIAGNOSIS — D509 Iron deficiency anemia, unspecified: Secondary | ICD-10-CM

## 2016-05-01 DIAGNOSIS — N189 Chronic kidney disease, unspecified: Secondary | ICD-10-CM | POA: Diagnosis not present

## 2016-05-01 DIAGNOSIS — D638 Anemia in other chronic diseases classified elsewhere: Secondary | ICD-10-CM

## 2016-05-01 LAB — SAMPLE TO BLOOD BANK

## 2016-05-01 LAB — HEMOGLOBIN: HEMOGLOBIN: 10.5 g/dL — AB (ref 12.0–16.0)

## 2016-05-04 ENCOUNTER — Inpatient Hospital Stay: Payer: Medicare Other

## 2016-05-04 VITALS — BP 122/51 | HR 67 | Temp 97.1°F | Resp 18

## 2016-05-04 DIAGNOSIS — D509 Iron deficiency anemia, unspecified: Secondary | ICD-10-CM

## 2016-05-04 DIAGNOSIS — D638 Anemia in other chronic diseases classified elsewhere: Secondary | ICD-10-CM

## 2016-05-04 DIAGNOSIS — N189 Chronic kidney disease, unspecified: Secondary | ICD-10-CM | POA: Diagnosis not present

## 2016-05-04 MED ORDER — SODIUM CHLORIDE 0.9 % IV SOLN
510.0000 mg | Freq: Once | INTRAVENOUS | Status: AC
  Administered 2016-05-04: 510 mg via INTRAVENOUS
  Filled 2016-05-04: qty 17

## 2016-05-04 MED ORDER — SODIUM CHLORIDE 0.9 % IV SOLN
Freq: Once | INTRAVENOUS | Status: AC
  Administered 2016-05-04: 09:00:00 via INTRAVENOUS
  Filled 2016-05-04: qty 1000

## 2016-05-07 ENCOUNTER — Ambulatory Visit
Admission: RE | Admit: 2016-05-07 | Discharge: 2016-05-07 | Disposition: A | Discharge status: Home / Self Care | Payer: Medicare Other | Source: Ambulatory Visit | Attending: Nurse Practitioner | Admitting: Nurse Practitioner

## 2016-05-07 DIAGNOSIS — R1319 Other dysphagia: Secondary | ICD-10-CM | POA: Diagnosis present

## 2016-05-07 DIAGNOSIS — R1314 Dysphagia, pharyngoesophageal phase: Secondary | ICD-10-CM

## 2016-05-07 DIAGNOSIS — K222 Esophageal obstruction: Secondary | ICD-10-CM | POA: Diagnosis not present

## 2016-05-07 NOTE — Therapy (Signed)
[place], [place], [address] Phone: [phone]   Fax:     Modified Barium Swallow  Patient Details  Name: Tanya Townsend MRN: [id number] Date of Birth: [birth year] No Data Recorded  Encounter Date: 05/07/2016    Past Medical History  Diagnosis Date  . IDA (iron deficiency anemia)   . Pulmonary nodules   . Elevated carcinoembryonic antigen (CEA)   . History of atrial fibrillation   . AV malformation of gastrointestinal tract     AV malformations of the duodenum  . Coronary artery disease   . History of MI (myocardial infarction)   . Hypercholesteremia   . Osteoporosis   . History of esophageal stricture   . Anxiety   . HTN (hypertension)   . DM (diabetes mellitus) ([place])   . Glaucoma   . Osteoarthritis   . Aortic sclerosis ([place])   . Osteoarthritis   . H/O bone scan 04/27/2011  . Dementia   . Stroke [facility])     Past Surgical History  Procedure Laterality Date  . Tonsillectomy    . Thyroidectomy, partial    . Appendectomy    . Endovascular angioplasty femoral/popliteal artery w/stent/atherectomy  1990  . Upper gastrointestinal endoscopy  2015  . Colonoscopy    . Polypectomy      There were no vitals filed for this visit.   Subjective: Patient behavior: (alertness, ability to follow instructions, etc.): Patient able to follow directions for this study  Chief complaint: history of esophageal dilations and torturous esophagus    Objective:  Radiological Procedure: A videoflouroscopic evaluation of oral-preparatory, reflex initiation, and pharyngeal phases of the swallow was performed; as well as a screening of the upper esophageal phase.  I. POSTURE: Upright in MBS chair  II. VIEW: lateral  III. COMPENSATORY STRATEGIES: N/A  IV. BOLUSES ADMINISTERED:   Thin Liquid: 1 large   Nectar-thick Liquid: 1 large    Puree: 2 teaspoon presentations   Mechanical Soft: 1/4  graham cracker in applesauce  V. RESULTS OF EVALUATION: A. ORAL PREPARATORY PHASE: (The lips, tongue, and velum are observed for strength and coordination)       **Overall Severity Rating: Within normal limits  B. SWALLOW INITIATION/REFLEX: (The reflex is normal if "triggered" by the time the bolus reached the base of the tongue)  **Overall Severity Rating: Mild; triggers while falling from the valleculae to the pyriform sinuses  C. PHARYNGEAL PHASE: (Pharyngeal function is normal if the bolus shows rapid, smooth, and continuous transit through the pharynx and there is no pharyngeal residue after the swallow)  **Overall Severity Rating: Minimal; decreased tongue base retraction with mild vallecular residue  D. LARYNGEAL PENETRATION: (Material entering into the laryngeal inlet/vestibule but not aspirated) None  E. ASPIRATION: None  F. ESOPHAGEAL PHASE: (Screening of the upper esophagus) Screening scans of the esophagus reveals bolus retention up to the clavical and corkscrew appearance of the distal esophagus.    ASSESSMENT: 82 year old woman, with intermittent dysphagia, is presenting with minimal oropharyngeal dysphagia.  Oral control of the bolus including oral hold, rotary mastication, and anterior to posterior transfer are within normal limits. Timing of the pharyngeal swallow is delayed, triggering while falling from the valleculae to the pyriform sinuses.  Mild decreased pharyngeal pressure generation with mild vallecular residue post swallow. Other aspects of the pharyngeal stage of swallowing including hyolaryngeal excursion, epiglottic inversion, duration/amplitude of UES opening, and laryngeal vestibule closure at the height of the swallow are  within normal limits.  There was no observed laryngeal penetration or aspiration.  The patient is at not at significant risk for prandial aspiration.  Screening scans of the esophagus reveals bolus retention up to the clavical and corkscrew  appearance of the distal esophagus.  The patient and her daughter were given basic tips for safe swallowing with esophageal dysmotility problems.  PLAN/RECOMMENDATIONS:  A. Diet: regular as tolerated (may benefit from moistening and softening foods)   B. Swallowing Precautions: Reflux precautions   C. Recommended consultation to: follow up with GI as recommended   D. Therapy recommendations N/A   E. Results and recommendations were discussed with the patient and her daughter immediately following the study and final report routed to referring practitioner    Patient will benefit from skilled therapeutic intervention in order to improve the following deficits and impairments:   Dysphagia, pharyngoesophageal  Intermittent dysphagia - Plan: DG OP Swallowing Func-Medicare/Speech Path, DG OP Swallowing Func-Medicare/Speech Path      G-Codes - 2016/06/05 1322    Functional Assessment Tool Used MBS, clinical judgment   Functional Limitations Swallowing   Swallow Current Status [id number]) At least 1 percent but less than 20 percent impaired, limited or restricted   Swallow Goal Status [id number]) At least 1 percent but less than 20 percent impaired, limited or restricted   Swallow Discharge Status [phone]) At least 1 percent but less than 20 percent impaired, limited or restricted          Problem List Patient Active Problem List   Diagnosis Date Noted  . Abnormal presence of protein in urine 04/24/2015  . OP (osteoporosis) 04/24/2015  . Arthritis, degenerative 04/24/2015  . Esophageal stenosis 04/24/2015  . Anemia, iron deficiency 04/24/2015  . Arteriosclerosis of coronary artery 04/24/2015  . Aortic sclerosis ([place]) 04/24/2015  . Anxiety 04/24/2015  . Allergic rhinitis 04/24/2015  . Anemia of chronic disease 04/21/2015  . Diabetes ([place]) 03/30/2014  . Hypercholesterolemia without hypertriglyceridemia 03/30/2014  . LBP (low back pain) 03/30/2014  . Benign essential HTN 03/30/2014  .  Can't get food down 03/30/2014  . Inflammatory polyarthritis ([place]) 03/30/2014  . A-fib ([place]) 03/30/2014  . Absolute anemia 03/30/2014   Leroy Sea, MS/CCC- SLP  Lou Miner 2016/06/05, 1:23 PM  [facility] [place], [place], [address] Phone: [phone]   Fax:     Name: Tanya Townsend MRN: [id number] Date of Birth: [birth year]

## 2016-05-08 ENCOUNTER — Inpatient Hospital Stay: Payer: Medicare Other

## 2016-05-08 DIAGNOSIS — D638 Anemia in other chronic diseases classified elsewhere: Secondary | ICD-10-CM

## 2016-05-08 DIAGNOSIS — N189 Chronic kidney disease, unspecified: Secondary | ICD-10-CM | POA: Diagnosis not present

## 2016-05-08 DIAGNOSIS — D509 Iron deficiency anemia, unspecified: Secondary | ICD-10-CM

## 2016-05-08 LAB — HEMOGLOBIN: Hemoglobin: 10.2 g/dL — ABNORMAL LOW (ref 12.0–16.0)

## 2016-05-08 LAB — SAMPLE TO BLOOD BANK

## 2016-05-15 ENCOUNTER — Inpatient Hospital Stay: Payer: Medicare Other

## 2016-05-15 DIAGNOSIS — D509 Iron deficiency anemia, unspecified: Secondary | ICD-10-CM

## 2016-05-15 DIAGNOSIS — N189 Chronic kidney disease, unspecified: Secondary | ICD-10-CM | POA: Diagnosis not present

## 2016-05-15 DIAGNOSIS — D638 Anemia in other chronic diseases classified elsewhere: Secondary | ICD-10-CM

## 2016-05-15 LAB — HEMOGLOBIN: HEMOGLOBIN: 10.4 g/dL — AB (ref 12.0–16.0)

## 2016-05-15 LAB — SAMPLE TO BLOOD BANK

## 2016-05-15 NOTE — Progress Notes (Signed)
This encounter was created in error - please disregard.

## 2016-05-22 ENCOUNTER — Inpatient Hospital Stay: Payer: Medicare Other

## 2016-05-22 VITALS — BP 122/47 | HR 58 | Resp 18

## 2016-05-22 DIAGNOSIS — N189 Chronic kidney disease, unspecified: Secondary | ICD-10-CM | POA: Diagnosis not present

## 2016-05-22 DIAGNOSIS — D509 Iron deficiency anemia, unspecified: Secondary | ICD-10-CM

## 2016-05-22 DIAGNOSIS — D638 Anemia in other chronic diseases classified elsewhere: Secondary | ICD-10-CM

## 2016-05-22 LAB — SAMPLE TO BLOOD BANK

## 2016-05-22 LAB — HEMOGLOBIN: HEMOGLOBIN: 9.9 g/dL — AB (ref 12.0–16.0)

## 2016-05-22 MED ORDER — EPOETIN ALFA 40000 UNIT/ML IJ SOLN
40000.0000 [IU] | INTRAMUSCULAR | Status: DC
  Administered 2016-05-22: 40000 [IU] via SUBCUTANEOUS
  Filled 2016-05-22: qty 1

## 2016-05-29 ENCOUNTER — Inpatient Hospital Stay: Payer: Medicare Other

## 2016-05-29 ENCOUNTER — Inpatient Hospital Stay: Payer: Medicare Other | Attending: Internal Medicine

## 2016-05-29 VITALS — BP 127/69 | HR 57 | Temp 96.8°F

## 2016-05-29 DIAGNOSIS — I4891 Unspecified atrial fibrillation: Secondary | ICD-10-CM | POA: Insufficient documentation

## 2016-05-29 DIAGNOSIS — F0390 Unspecified dementia without behavioral disturbance: Secondary | ICD-10-CM | POA: Diagnosis not present

## 2016-05-29 DIAGNOSIS — Z8673 Personal history of transient ischemic attack (TIA), and cerebral infarction without residual deficits: Secondary | ICD-10-CM | POA: Diagnosis not present

## 2016-05-29 DIAGNOSIS — I129 Hypertensive chronic kidney disease with stage 1 through stage 4 chronic kidney disease, or unspecified chronic kidney disease: Secondary | ICD-10-CM | POA: Insufficient documentation

## 2016-05-29 DIAGNOSIS — I10 Essential (primary) hypertension: Secondary | ICD-10-CM | POA: Diagnosis not present

## 2016-05-29 DIAGNOSIS — E119 Type 2 diabetes mellitus without complications: Secondary | ICD-10-CM | POA: Diagnosis not present

## 2016-05-29 DIAGNOSIS — F419 Anxiety disorder, unspecified: Secondary | ICD-10-CM | POA: Insufficient documentation

## 2016-05-29 DIAGNOSIS — D509 Iron deficiency anemia, unspecified: Secondary | ICD-10-CM

## 2016-05-29 DIAGNOSIS — Z87891 Personal history of nicotine dependence: Secondary | ICD-10-CM | POA: Diagnosis not present

## 2016-05-29 DIAGNOSIS — D638 Anemia in other chronic diseases classified elsewhere: Secondary | ICD-10-CM

## 2016-05-29 DIAGNOSIS — M1990 Unspecified osteoarthritis, unspecified site: Secondary | ICD-10-CM | POA: Insufficient documentation

## 2016-05-29 DIAGNOSIS — Z79899 Other long term (current) drug therapy: Secondary | ICD-10-CM | POA: Insufficient documentation

## 2016-05-29 DIAGNOSIS — I252 Old myocardial infarction: Secondary | ICD-10-CM | POA: Insufficient documentation

## 2016-05-29 DIAGNOSIS — H409 Unspecified glaucoma: Secondary | ICD-10-CM | POA: Diagnosis not present

## 2016-05-29 DIAGNOSIS — E7800 Pure hypercholesterolemia, unspecified: Secondary | ICD-10-CM | POA: Diagnosis not present

## 2016-05-29 DIAGNOSIS — D631 Anemia in chronic kidney disease: Secondary | ICD-10-CM | POA: Diagnosis not present

## 2016-05-29 DIAGNOSIS — I2510 Atherosclerotic heart disease of native coronary artery without angina pectoris: Secondary | ICD-10-CM | POA: Diagnosis not present

## 2016-05-29 DIAGNOSIS — N189 Chronic kidney disease, unspecified: Secondary | ICD-10-CM | POA: Insufficient documentation

## 2016-05-29 LAB — HEMOGLOBIN: Hemoglobin: 9.5 g/dL — ABNORMAL LOW (ref 12.0–16.0)

## 2016-05-29 LAB — SAMPLE TO BLOOD BANK

## 2016-05-29 MED ORDER — EPOETIN ALFA 40000 UNIT/ML IJ SOLN
40000.0000 [IU] | INTRAMUSCULAR | Status: DC
Start: 2016-05-29 — End: 2016-05-29
  Administered 2016-05-29: 40000 [IU] via SUBCUTANEOUS
  Filled 2016-05-29: qty 1

## 2016-06-05 ENCOUNTER — Inpatient Hospital Stay: Payer: Medicare Other

## 2016-06-05 DIAGNOSIS — D509 Iron deficiency anemia, unspecified: Secondary | ICD-10-CM

## 2016-06-05 DIAGNOSIS — I129 Hypertensive chronic kidney disease with stage 1 through stage 4 chronic kidney disease, or unspecified chronic kidney disease: Secondary | ICD-10-CM | POA: Diagnosis not present

## 2016-06-05 DIAGNOSIS — D638 Anemia in other chronic diseases classified elsewhere: Secondary | ICD-10-CM

## 2016-06-05 LAB — HEMOGLOBIN: HEMOGLOBIN: 10.4 g/dL — AB (ref 12.0–16.0)

## 2016-06-12 ENCOUNTER — Inpatient Hospital Stay: Payer: Medicare Other

## 2016-06-12 ENCOUNTER — Telehealth: Payer: Self-pay | Admitting: Internal Medicine

## 2016-06-12 ENCOUNTER — Other Ambulatory Visit: Payer: Self-pay | Admitting: Internal Medicine

## 2016-06-12 VITALS — BP 124/55 | HR 57 | Temp 95.5°F | Resp 20

## 2016-06-12 DIAGNOSIS — D638 Anemia in other chronic diseases classified elsewhere: Secondary | ICD-10-CM

## 2016-06-12 DIAGNOSIS — D509 Iron deficiency anemia, unspecified: Secondary | ICD-10-CM

## 2016-06-12 DIAGNOSIS — I129 Hypertensive chronic kidney disease with stage 1 through stage 4 chronic kidney disease, or unspecified chronic kidney disease: Secondary | ICD-10-CM | POA: Diagnosis not present

## 2016-06-12 LAB — COMPREHENSIVE METABOLIC PANEL
ALBUMIN: 3.7 g/dL (ref 3.5–5.0)
ALK PHOS: 80 U/L (ref 38–126)
ALT: 19 U/L (ref 14–54)
AST: 18 U/L (ref 15–41)
Anion gap: 5 (ref 5–15)
BUN: 26 mg/dL — AB (ref 6–20)
CHLORIDE: 107 mmol/L (ref 101–111)
CO2: 29 mmol/L (ref 22–32)
Calcium: 9.4 mg/dL (ref 8.9–10.3)
Creatinine, Ser: 0.73 mg/dL (ref 0.44–1.00)
GFR calc Af Amer: >60 mL/min (ref >60)
GFR calc non Af Amer: >60 mL/min (ref >60)
Glucose, Bld: 125 mg/dL — ABNORMAL HIGH (ref 65–99)
POTASSIUM: 3.6 mmol/L (ref 3.5–5.1)
SODIUM: 141 mmol/L (ref 135–145)
Total Bilirubin: 0.5 mg/dL (ref 0.3–1.2)
Total Protein: 6.3 g/dL — ABNORMAL LOW (ref 6.5–8.1)

## 2016-06-12 LAB — SAMPLE TO BLOOD BANK

## 2016-06-12 LAB — CBC WITH DIFFERENTIAL/PLATELET
BASOS PCT: 1 %
Basophils Absolute: 0 10*3/uL (ref 0–0.1)
EOS ABS: 0.1 10*3/uL (ref 0–0.7)
Eosinophils Relative: 2 %
HCT: 30.6 % — ABNORMAL LOW (ref 35.0–47.0)
HEMOGLOBIN: 9.6 g/dL — AB (ref 12.0–16.0)
Lymphocytes Relative: 11 %
Lymphs Abs: 0.3 10*3/uL — ABNORMAL LOW (ref 1.0–3.6)
MCH: 26.7 pg (ref 26.0–34.0)
MCHC: 31.3 g/dL — ABNORMAL LOW (ref 32.0–36.0)
MCV: 85.4 fL (ref 80.0–100.0)
Monocytes Absolute: 0.3 10*3/uL (ref 0.2–0.9)
Monocytes Relative: 8 %
NEUTROS PCT: 78 %
Neutro Abs: 2.5 10*3/uL (ref 1.4–6.5)
Platelets: 226 10*3/uL (ref 150–440)
RBC: 3.59 MIL/uL — AB (ref 3.80–5.20)
RDW: 19.2 % — ABNORMAL HIGH (ref 11.5–14.5)
WBC: 3.2 10*3/uL — AB (ref 3.6–11.0)

## 2016-06-12 LAB — IRON AND TIBC
Iron: 16 ug/dL — ABNORMAL LOW (ref 28–170)
SATURATION RATIOS: 5 % — AB (ref 10.4–31.8)
TIBC: 328 ug/dL (ref 250–450)
UIBC: 312 ug/dL

## 2016-06-12 LAB — FERRITIN: FERRITIN: 26 ng/mL (ref 11–307)

## 2016-06-12 MED ORDER — EPOETIN ALFA 40000 UNIT/ML IJ SOLN
40000.0000 [IU] | INTRAMUSCULAR | Status: DC
  Administered 2016-06-12: 40000 [IU] via SUBCUTANEOUS

## 2016-06-12 NOTE — Telephone Encounter (Signed)
Ferritin/Iron panels were drawn this morning-pending results.

## 2016-06-12 NOTE — Telephone Encounter (Signed)
Lawerance Sabal, patient daughter, said she would really like to have her mom's iron checked at her next visit. She asked if Dr. B is planning to check it next time and if not she asked if he would please consider adding it on to her lab orders because she stated she is very familiar with her mom's patterns in this regard and she is concerned that the iron may be dropping. Thank you!

## 2016-06-12 NOTE — Telephone Encounter (Signed)
Pt needs IV iron infusion this week. msg sent to cancer center scheduling to arrange for this appointment.

## 2016-06-15 ENCOUNTER — Inpatient Hospital Stay: Payer: Medicare Other

## 2016-06-15 VITALS — BP 116/61 | HR 54 | Temp 97.1°F | Resp 20

## 2016-06-15 DIAGNOSIS — D509 Iron deficiency anemia, unspecified: Secondary | ICD-10-CM

## 2016-06-15 DIAGNOSIS — D638 Anemia in other chronic diseases classified elsewhere: Secondary | ICD-10-CM

## 2016-06-15 DIAGNOSIS — I129 Hypertensive chronic kidney disease with stage 1 through stage 4 chronic kidney disease, or unspecified chronic kidney disease: Secondary | ICD-10-CM | POA: Diagnosis not present

## 2016-06-15 MED ORDER — SODIUM CHLORIDE 0.9 % IV SOLN
510.0000 mg | Freq: Once | INTRAVENOUS | Status: AC
  Administered 2016-06-15: 510 mg via INTRAVENOUS
  Filled 2016-06-15: qty 17

## 2016-06-18 ENCOUNTER — Other Ambulatory Visit: Payer: Self-pay | Admitting: *Deleted

## 2016-06-18 DIAGNOSIS — D638 Anemia in other chronic diseases classified elsewhere: Secondary | ICD-10-CM

## 2016-06-19 ENCOUNTER — Inpatient Hospital Stay: Payer: Medicare Other

## 2016-06-19 ENCOUNTER — Inpatient Hospital Stay (HOSPITAL_BASED_OUTPATIENT_CLINIC_OR_DEPARTMENT_OTHER): Payer: Medicare Other | Admitting: Internal Medicine

## 2016-06-19 VITALS — BP 117/71 | HR 62 | Temp 97.0°F | Resp 18 | Wt 124.3 lb

## 2016-06-19 DIAGNOSIS — D509 Iron deficiency anemia, unspecified: Secondary | ICD-10-CM | POA: Diagnosis not present

## 2016-06-19 DIAGNOSIS — D638 Anemia in other chronic diseases classified elsewhere: Secondary | ICD-10-CM

## 2016-06-19 DIAGNOSIS — Z79899 Other long term (current) drug therapy: Secondary | ICD-10-CM | POA: Diagnosis not present

## 2016-06-19 DIAGNOSIS — D649 Anemia, unspecified: Secondary | ICD-10-CM

## 2016-06-19 DIAGNOSIS — I129 Hypertensive chronic kidney disease with stage 1 through stage 4 chronic kidney disease, or unspecified chronic kidney disease: Secondary | ICD-10-CM | POA: Diagnosis not present

## 2016-06-19 LAB — CBC WITH DIFFERENTIAL/PLATELET
BASOS PCT: 1 %
Basophils Absolute: 0 10*3/uL (ref 0–0.1)
EOS ABS: 0.1 10*3/uL (ref 0–0.7)
EOS PCT: 3 %
HCT: 31.6 % — ABNORMAL LOW (ref 35.0–47.0)
HEMOGLOBIN: 9.8 g/dL — AB (ref 12.0–16.0)
LYMPHS ABS: 0.4 10*3/uL — AB (ref 1.0–3.6)
Lymphocytes Relative: 12 %
MCH: 26.7 pg (ref 26.0–34.0)
MCHC: 30.9 g/dL — ABNORMAL LOW (ref 32.0–36.0)
MCV: 86.2 fL (ref 80.0–100.0)
MONOS PCT: 8 %
Monocytes Absolute: 0.3 10*3/uL (ref 0.2–0.9)
NEUTROS PCT: 76 %
Neutro Abs: 2.7 10*3/uL (ref 1.4–6.5)
Platelets: 235 10*3/uL (ref 150–440)
RBC: 3.67 MIL/uL — ABNORMAL LOW (ref 3.80–5.20)
RDW: 20.6 % — ABNORMAL HIGH (ref 11.5–14.5)
WBC: 3.6 10*3/uL (ref 3.6–11.0)

## 2016-06-19 LAB — COMPREHENSIVE METABOLIC PANEL
ALK PHOS: 83 U/L (ref 38–126)
ALT: 18 U/L (ref 14–54)
AST: 21 U/L (ref 15–41)
Albumin: 3.6 g/dL (ref 3.5–5.0)
Anion gap: 7 (ref 5–15)
BILIRUBIN TOTAL: 0.5 mg/dL (ref 0.3–1.2)
BUN: 14 mg/dL (ref 6–20)
CALCIUM: 9.5 mg/dL (ref 8.9–10.3)
CO2: 28 mmol/L (ref 22–32)
CREATININE: 0.88 mg/dL (ref 0.44–1.00)
Chloride: 104 mmol/L (ref 101–111)
GFR, EST NON AFRICAN AMERICAN: 55 mL/min — AB (ref >60)
Glucose, Bld: 168 mg/dL — ABNORMAL HIGH (ref 65–99)
Potassium: 3.9 mmol/L (ref 3.5–5.1)
Sodium: 139 mmol/L (ref 135–145)
TOTAL PROTEIN: 6.2 g/dL — AB (ref 6.5–8.1)

## 2016-06-19 LAB — IRON AND TIBC
Iron: 125 ug/dL (ref 28–170)
SATURATION RATIOS: 42 % — AB (ref 10.4–31.8)
TIBC: 301 ug/dL (ref 250–450)
UIBC: 176 ug/dL

## 2016-06-19 LAB — SAMPLE TO BLOOD BANK

## 2016-06-19 LAB — FERRITIN: Ferritin: 224 ng/mL (ref 11–307)

## 2016-06-19 MED ORDER — EPOETIN ALFA 40000 UNIT/ML IJ SOLN
40000.0000 [IU] | INTRAMUSCULAR | Status: DC
  Administered 2016-06-19: 40000 [IU] via SUBCUTANEOUS

## 2016-06-19 NOTE — Assessment & Plan Note (Signed)
#   Anemia- MDS/iron def Anemia versus other causes currently on Procrit 40,000 units on a weekly basis. Today hemoglobin is 9.7. Proceed with procrit. S/p IV Ferrahem x1 last week.   # Weekly H&H/Procrit; follow-up with me in 3 months with a CBC CMP and iron studies.  # recommend cerumen disimpaction if ear popping continues to be a problem.   # 15 minutes face-to-face with the patient discussing the above plan of care; more than 50% of time spent on counseling and coordination.

## 2016-06-19 NOTE — Progress Notes (Signed)
Patient states her left ear has been popping.

## 2016-06-19 NOTE — Progress Notes (Signed)
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  A very pleasant [name] patient with long-standing history of anemia and also moderate dementia is here for follow-up of her anemia. Patient received IV iron one week ago.  As per the daughter, patient has no new shortness of breath. Her appetite is good. Complains of "ear popping".  As per the daughter no blood in stools or black stools; no nausea no vomiting abdominal pain. Weight is steady.  REVIEW OF SYSTEMS:  Difficult to assess given her dementia.  PAST MEDICAL HISTORY :  Past Medical History  Diagnosis Date  . IDA (iron deficiency anemia)   . Pulmonary nodules   . Elevated carcinoembryonic antigen (CEA)   . History of atrial fibrillation   . AV malformation of gastrointestinal tract     AV malformations of the duodenum  . Coronary artery disease   . History of MI (myocardial infarction)   . Hypercholesteremia   . Osteoporosis   . History of esophageal stricture   . Anxiety   . HTN (hypertension)   . DM (diabetes mellitus) ([place])   . Glaucoma   . Osteoarthritis   . Aortic sclerosis ([place])   . Osteoarthritis   . H/O bone scan 04/27/2011  . Dementia   . Stroke [facility])     PAST SURGICAL HISTORY :   Past Surgical History  Procedure Laterality Date  . Tonsillectomy    . Thyroidectomy, partial    . Appendectomy    . Endovascular angioplasty femoral/popliteal artery w/stent/atherectomy  1990  . Upper gastrointestinal endoscopy  2015  . Colonoscopy    . Polypectomy      FAMILY HISTORY :   Family History  Problem Relation Age of Onset   . Coronary artery disease Mother   . Diabetes Mellitus II Mother   . Heart attack Father   . Heart attack Sister   . Aortic aneurysm Sister   . Prostate cancer Brother     brother #1  . Heart attack Brother     brother #1  . Prostate cancer Brother     brother #2  . Heart attack Brother     brother #2  . Heart attack Brother     brother #3    SOCIAL HISTORY:   Social History  Substance Use Topics  . Smoking status: Former Smoker -- 0.50 packs/day for 40 years    Types: Cigarettes  . Smokeless tobacco: Never Used     Comment: quit in 1991  . Alcohol Use: No     Comment: previous ETOH abuse-stopped 3 years ago    ALLERGIES:  is allergic to penicillin g; sulfa antibiotics; ace inhibitors; and penicillin v potassium.  MEDICATIONS:  Current Outpatient Prescriptions  Medication Sig Dispense Refill  . acetaminophen (TYLENOL) 325 MG tablet Take 650 mg by mouth.    Marland Kitchen amLODipine (NORVASC) 5 MG tablet Take 5 mg by mouth daily.  3  . atorvastatin (LIPITOR) 20 MG tablet Take 20 mg by mouth.    . Calcium Carb-Cholecalciferol 600-800 MG-UNIT CHEW Chew 1 tablet by mouth at bedtime.    Marland Kitchen  donepezil (ARICEPT) 10 MG tablet Take 10 mg by mouth at bedtime.  6  . isosorbide mononitrate (IMDUR) 30 MG 24 hr tablet Take by mouth.    Marland Kitchen LYCOPENE PO Take by mouth.    . metoprolol succinate (TOPROL-XL) 100 MG 24 hr tablet Take 100 mg by mouth daily.  3  . Multiple Vitamins-Minerals (CENTRUM SILVER PO) Take 1 tablet by mouth 1 day or 1 dose.    . Multiple Vitamins-Minerals (PRESERVISION AREDS) TABS Take 1 tablet by mouth 2 (two) times daily.     . Multiple Vitamins-Minerals (PRESERVISION/LUTEIN PO) Take by mouth.    . nitroGLYCERIN (NITROSTAT) 0.4 MG SL tablet Place under the tongue.    Marland Kitchen omeprazole (PRILOSEC) 10 MG capsule Take 20 mg by mouth.    . pantoprazole (PROTONIX) 40 MG tablet Take 40 mg by mouth daily.  5  . simvastatin (ZOCOR) 80 MG tablet Take 80 mg by mouth daily.  0  . traMADol  (ULTRAM) 50 MG tablet Take 1 tablet (50 mg total) by mouth every 6 (six) hours as needed. 15 tablet 0   No current facility-administered medications for this visit.   Facility-Administered Medications Ordered in Other Visits  Medication Dose Route Frequency Provider Last Rate Last Dose  . 0.9 %  sodium chloride infusion  250 mL Intravenous Once Evlyn Kanner, NP      . acetaminophen (TYLENOL) tablet 650 mg  650 mg Oral Once Evlyn Kanner, NP      . diphenhydrAMINE (BENADRYL) capsule 25 mg  25 mg Oral Once Evlyn Kanner, NP        PHYSICAL EXAMINATION: ECOG PERFORMANCE STATUS: 1 - Symptomatic but completely ambulatory  BP 117/71 mmHg  Pulse 62  Temp(Src) 97 F (36.1 C) (Tympanic)  Resp 18  Wt 124 lb 5.4 oz (56.4 kg)  Filed Weights   06/19/16 0928  Weight: 124 lb 5.4 oz (56.4 kg)    GENERAL: Well-nourished well-developed; Alert, no distress and comfortable.   Accompanied by her daughter. EYES: positive for pallor OROPHARYNX: no thrush or ulceration. Bilateral ear exam- cerumen buildup bilaterally NECK: supple, no masses felt LYMPH:  no palpable lymphadenopathy in the cervical, axillary or inguinal regions LUNGS: Clear to auscultation bilaterally. No significant crackles noted HEART/CVS: regular rate & rhythm and positive for systolic murmur; No lower extremity edema ABDOMEN:abdomen soft, non-tender and normal bowel sounds Musculoskeletal:no cyanosis of digits and no clubbing  PSYCH: alert & oriented x 3 with fluent speech NEURO: no focal motor/sensory deficits SKIN:  no rashes or significant lesions  LABORATORY DATA:  I have reviewed the data as listed    Component Value Date/Time   NA 139 06/19/2016 0858   NA 140 04/19/2015 0852   K 3.9 06/19/2016 0858   K 3.6 04/19/2015 0852   CL 104 06/19/2016 0858   CL 102 04/19/2015 0852   CO2 28 06/19/2016 0858   CO2 29 04/19/2015 0852   GLUCOSE 168* 06/19/2016 0858   GLUCOSE 118* 04/19/2015 0852   BUN 14 06/19/2016  0858   BUN 17 04/19/2015 0852   CREATININE 0.88 06/19/2016 0858   CREATININE 0.83 04/19/2015 0852   CALCIUM 9.5 06/19/2016 0858   CALCIUM 9.3 04/19/2015 0852   PROT 6.2* 06/19/2016 0858   PROT 6.3* 04/19/2015 0852   ALBUMIN 3.6 06/19/2016 0858   ALBUMIN 3.5 04/19/2015 0852   AST 21 06/19/2016 0858   AST 30 04/19/2015 0852   ALT 18 06/19/2016 0858   ALT 26 04/19/2015 [id number]  ALKPHOS 83 06/19/2016 0858   ALKPHOS 71 04/19/2015 0852   BILITOT 0.5 06/19/2016 0858   BILITOT 0.4 04/19/2015 0852   GFRNONAA 55* 06/19/2016 0858   GFRNONAA >60 04/19/2015 0852   GFRNONAA 50* 09/17/2014 0852   GFRAA >60 06/19/2016 0858   GFRAA >60 04/19/2015 0852   GFRAA >60 09/17/2014 0852    No results found for: SPEP, UPEP  Lab Results  Component Value Date   WBC 3.6 06/19/2016   NEUTROABS 2.7 06/19/2016   HGB 9.8* 06/19/2016   HCT 31.6* 06/19/2016   MCV 86.2 06/19/2016   PLT 235 06/19/2016      Chemistry      Component Value Date/Time   NA 139 06/19/2016 0858   NA 140 04/19/2015 0852   K 3.9 06/19/2016 0858   K 3.6 04/19/2015 0852   CL 104 06/19/2016 0858   CL 102 04/19/2015 0852   CO2 28 06/19/2016 0858   CO2 29 04/19/2015 0852   BUN 14 06/19/2016 0858   BUN 17 04/19/2015 0852   CREATININE 0.88 06/19/2016 0858   CREATININE 0.83 04/19/2015 0852      Component Value Date/Time   CALCIUM 9.5 06/19/2016 0858   CALCIUM 9.3 04/19/2015 0852   ALKPHOS 83 06/19/2016 0858   ALKPHOS 71 04/19/2015 0852   AST 21 06/19/2016 0858   AST 30 04/19/2015 0852   ALT 18 06/19/2016 0858   ALT 26 04/19/2015 0852   BILITOT 0.5 06/19/2016 0858   BILITOT 0.4 04/19/2015 0852        ASSESSMENT & PLAN:   Absolute anemia # Anemia- MDS/iron def Anemia versus other causes currently on Procrit 40,000 units on a weekly basis. Today hemoglobin is 9.7. Proceed with procrit. S/p IV Ferrahem x1 last week.   # Weekly H&H/Procrit; follow-up with me in 3 months with a CBC CMP and iron studies.  # recommend  cerumen disimpaction if ear popping continues to be a problem.   # 15 minutes face-to-face with the patient discussing the above plan of care; more than 50% of time spent on counseling and coordination.      Cammie Sickle, MD 06/19/2016 5:42 PM

## 2016-06-19 NOTE — Progress Notes (Signed)
Increased performance status since last visit. Pt able to ambulate in [facility] center today w/o wheelchair. Daughter states that patient has been performing self-strengtenhing physical therapy exercises at home.

## 2016-06-27 ENCOUNTER — Inpatient Hospital Stay: Payer: Medicare Other | Attending: Internal Medicine

## 2016-06-27 ENCOUNTER — Ambulatory Visit: Payer: Medicare Other

## 2016-06-27 ENCOUNTER — Other Ambulatory Visit: Payer: Medicare Other

## 2016-06-27 DIAGNOSIS — D631 Anemia in chronic kidney disease: Secondary | ICD-10-CM | POA: Insufficient documentation

## 2016-06-27 DIAGNOSIS — D649 Anemia, unspecified: Secondary | ICD-10-CM

## 2016-06-27 DIAGNOSIS — Z79899 Other long term (current) drug therapy: Secondary | ICD-10-CM | POA: Diagnosis not present

## 2016-06-27 DIAGNOSIS — N189 Chronic kidney disease, unspecified: Secondary | ICD-10-CM | POA: Insufficient documentation

## 2016-06-27 LAB — HEMATOCRIT: HEMATOCRIT: 34.5 % — AB (ref 35.0–47.0)

## 2016-06-27 LAB — HEMOGLOBIN: Hemoglobin: 10.7 g/dL — ABNORMAL LOW (ref 12.0–16.0)

## 2016-07-03 ENCOUNTER — Inpatient Hospital Stay: Payer: Medicare Other

## 2016-07-03 DIAGNOSIS — D649 Anemia, unspecified: Secondary | ICD-10-CM

## 2016-07-03 DIAGNOSIS — N189 Chronic kidney disease, unspecified: Secondary | ICD-10-CM | POA: Diagnosis not present

## 2016-07-03 LAB — HEMATOCRIT: HEMATOCRIT: 35 % (ref 35.0–47.0)

## 2016-07-03 LAB — HEMOGLOBIN: HEMOGLOBIN: 11.1 g/dL — AB (ref 12.0–16.0)

## 2016-07-10 ENCOUNTER — Inpatient Hospital Stay: Payer: Medicare Other

## 2016-07-10 DIAGNOSIS — D649 Anemia, unspecified: Secondary | ICD-10-CM

## 2016-07-10 DIAGNOSIS — N189 Chronic kidney disease, unspecified: Secondary | ICD-10-CM | POA: Diagnosis not present

## 2016-07-10 LAB — HEMOGLOBIN: Hemoglobin: 10 g/dL — ABNORMAL LOW (ref 12.0–16.0)

## 2016-07-10 LAB — HEMATOCRIT: HEMATOCRIT: 31.9 % — AB (ref 35.0–47.0)

## 2016-07-17 ENCOUNTER — Other Ambulatory Visit: Payer: Self-pay | Admitting: Internal Medicine

## 2016-07-17 ENCOUNTER — Inpatient Hospital Stay: Payer: Medicare Other

## 2016-07-17 VITALS — BP 113/50 | HR 62 | Temp 96.6°F | Resp 16

## 2016-07-17 DIAGNOSIS — D638 Anemia in other chronic diseases classified elsewhere: Secondary | ICD-10-CM

## 2016-07-17 DIAGNOSIS — N189 Chronic kidney disease, unspecified: Secondary | ICD-10-CM | POA: Diagnosis not present

## 2016-07-17 DIAGNOSIS — D649 Anemia, unspecified: Secondary | ICD-10-CM

## 2016-07-17 DIAGNOSIS — D509 Iron deficiency anemia, unspecified: Secondary | ICD-10-CM

## 2016-07-17 LAB — HEMATOCRIT: HEMATOCRIT: 27.9 % — AB (ref 35.0–47.0)

## 2016-07-17 LAB — HEMOGLOBIN: Hemoglobin: 8.9 g/dL — ABNORMAL LOW (ref 12.0–16.0)

## 2016-07-17 MED ORDER — EPOETIN ALFA 40000 UNIT/ML IJ SOLN
40000.0000 [IU] | INTRAMUSCULAR | Status: DC
  Administered 2016-07-17: 40000 [IU] via SUBCUTANEOUS
  Filled 2016-07-17: qty 1

## 2016-07-18 ENCOUNTER — Inpatient Hospital Stay: Payer: Medicare Other

## 2016-07-18 VITALS — BP 115/54 | HR 54 | Temp 97.0°F | Resp 17

## 2016-07-18 DIAGNOSIS — D509 Iron deficiency anemia, unspecified: Secondary | ICD-10-CM

## 2016-07-18 DIAGNOSIS — D638 Anemia in other chronic diseases classified elsewhere: Secondary | ICD-10-CM

## 2016-07-18 DIAGNOSIS — N189 Chronic kidney disease, unspecified: Secondary | ICD-10-CM | POA: Diagnosis not present

## 2016-07-18 MED ORDER — SODIUM CHLORIDE 0.9 % IV SOLN
Freq: Once | INTRAVENOUS | Status: AC
  Administered 2016-07-18: 10:00:00 via INTRAVENOUS
  Filled 2016-07-18: qty 1000

## 2016-07-18 MED ORDER — FERUMOXYTOL INJECTION 510 MG/17 ML
510.0000 mg | Freq: Once | INTRAVENOUS | Status: AC
  Administered 2016-07-18: 510 mg via INTRAVENOUS
  Filled 2016-07-18: qty 17

## 2016-07-24 ENCOUNTER — Inpatient Hospital Stay: Payer: Medicare Other | Attending: Internal Medicine

## 2016-07-24 ENCOUNTER — Inpatient Hospital Stay: Payer: Medicare Other

## 2016-07-24 DIAGNOSIS — N189 Chronic kidney disease, unspecified: Secondary | ICD-10-CM | POA: Insufficient documentation

## 2016-07-24 DIAGNOSIS — D649 Anemia, unspecified: Secondary | ICD-10-CM | POA: Insufficient documentation

## 2016-07-24 DIAGNOSIS — Z79899 Other long term (current) drug therapy: Secondary | ICD-10-CM | POA: Insufficient documentation

## 2016-07-24 LAB — HEMOGLOBIN: Hemoglobin: 10.3 g/dL — ABNORMAL LOW (ref 12.0–16.0)

## 2016-07-24 LAB — HEMATOCRIT: HEMATOCRIT: 32.5 % — AB (ref 35.0–47.0)

## 2016-07-31 ENCOUNTER — Inpatient Hospital Stay: Payer: Medicare Other

## 2016-07-31 VITALS — BP 124/55 | HR 63 | Temp 97.5°F | Resp 16

## 2016-07-31 DIAGNOSIS — D509 Iron deficiency anemia, unspecified: Secondary | ICD-10-CM

## 2016-07-31 DIAGNOSIS — D649 Anemia, unspecified: Secondary | ICD-10-CM | POA: Diagnosis not present

## 2016-07-31 DIAGNOSIS — D638 Anemia in other chronic diseases classified elsewhere: Secondary | ICD-10-CM

## 2016-07-31 LAB — HEMATOCRIT: HCT: 30.8 % — ABNORMAL LOW (ref 35.0–47.0)

## 2016-07-31 LAB — HEMOGLOBIN: Hemoglobin: 9.8 g/dL — ABNORMAL LOW (ref 12.0–16.0)

## 2016-07-31 MED ORDER — EPOETIN ALFA 40000 UNIT/ML IJ SOLN
40000.0000 [IU] | INTRAMUSCULAR | Status: DC
  Administered 2016-07-31: 40000 [IU] via SUBCUTANEOUS

## 2016-08-07 ENCOUNTER — Inpatient Hospital Stay: Payer: Medicare Other

## 2016-08-07 VITALS — BP 131/68 | HR 70 | Temp 96.3°F | Resp 20

## 2016-08-07 DIAGNOSIS — D638 Anemia in other chronic diseases classified elsewhere: Secondary | ICD-10-CM

## 2016-08-07 DIAGNOSIS — D509 Iron deficiency anemia, unspecified: Secondary | ICD-10-CM

## 2016-08-07 DIAGNOSIS — D649 Anemia, unspecified: Secondary | ICD-10-CM | POA: Diagnosis not present

## 2016-08-07 LAB — HEMATOCRIT: HCT: 30.3 % — ABNORMAL LOW (ref 35.0–47.0)

## 2016-08-07 LAB — HEMOGLOBIN: Hemoglobin: 9.5 g/dL — ABNORMAL LOW (ref 12.0–16.0)

## 2016-08-07 MED ORDER — EPOETIN ALFA 40000 UNIT/ML IJ SOLN
40000.0000 [IU] | INTRAMUSCULAR | Status: DC
  Administered 2016-08-07: 40000 [IU] via SUBCUTANEOUS

## 2016-08-14 ENCOUNTER — Inpatient Hospital Stay: Payer: Medicare Other

## 2016-08-14 DIAGNOSIS — D649 Anemia, unspecified: Secondary | ICD-10-CM

## 2016-08-14 LAB — HEMATOCRIT: HEMATOCRIT: 31.7 % — AB (ref 35.0–47.0)

## 2016-08-14 LAB — HEMOGLOBIN: Hemoglobin: 10 g/dL — ABNORMAL LOW (ref 12.0–16.0)

## 2016-08-21 ENCOUNTER — Inpatient Hospital Stay: Payer: Medicare Other

## 2016-08-21 VITALS — BP 125/47 | HR 56 | Temp 97.0°F | Resp 20

## 2016-08-21 DIAGNOSIS — D649 Anemia, unspecified: Secondary | ICD-10-CM

## 2016-08-21 DIAGNOSIS — D509 Iron deficiency anemia, unspecified: Secondary | ICD-10-CM

## 2016-08-21 DIAGNOSIS — D638 Anemia in other chronic diseases classified elsewhere: Secondary | ICD-10-CM

## 2016-08-21 LAB — HEMATOCRIT: HCT: 31 % — ABNORMAL LOW (ref 35.0–47.0)

## 2016-08-21 LAB — HEMOGLOBIN: Hemoglobin: 9.7 g/dL — ABNORMAL LOW (ref 12.0–16.0)

## 2016-08-21 MED ORDER — EPOETIN ALFA 40000 UNIT/ML IJ SOLN
40000.0000 [IU] | INTRAMUSCULAR | Status: DC
Start: 2016-08-21 — End: 2016-08-21
  Administered 2016-08-21: 40000 [IU] via SUBCUTANEOUS
  Filled 2016-08-21: qty 1

## 2016-08-28 ENCOUNTER — Other Ambulatory Visit: Payer: Self-pay | Admitting: *Deleted

## 2016-08-28 ENCOUNTER — Inpatient Hospital Stay: Payer: Medicare Other | Attending: Internal Medicine

## 2016-08-28 ENCOUNTER — Telehealth: Payer: Self-pay | Admitting: *Deleted

## 2016-08-28 ENCOUNTER — Inpatient Hospital Stay: Payer: Medicare Other

## 2016-08-28 ENCOUNTER — Other Ambulatory Visit: Payer: Self-pay | Admitting: Internal Medicine

## 2016-08-28 VITALS — BP 114/67 | HR 65 | Temp 96.7°F | Resp 20

## 2016-08-28 DIAGNOSIS — I10 Essential (primary) hypertension: Secondary | ICD-10-CM | POA: Diagnosis not present

## 2016-08-28 DIAGNOSIS — F419 Anxiety disorder, unspecified: Secondary | ICD-10-CM | POA: Insufficient documentation

## 2016-08-28 DIAGNOSIS — I252 Old myocardial infarction: Secondary | ICD-10-CM | POA: Diagnosis not present

## 2016-08-28 DIAGNOSIS — Q2733 Arteriovenous malformation of digestive system vessel: Secondary | ICD-10-CM | POA: Diagnosis not present

## 2016-08-28 DIAGNOSIS — N189 Chronic kidney disease, unspecified: Secondary | ICD-10-CM | POA: Diagnosis not present

## 2016-08-28 DIAGNOSIS — D649 Anemia, unspecified: Secondary | ICD-10-CM

## 2016-08-28 DIAGNOSIS — E119 Type 2 diabetes mellitus without complications: Secondary | ICD-10-CM | POA: Diagnosis not present

## 2016-08-28 DIAGNOSIS — I700 Atherosclerosis of aorta: Secondary | ICD-10-CM | POA: Diagnosis not present

## 2016-08-28 DIAGNOSIS — I129 Hypertensive chronic kidney disease with stage 1 through stage 4 chronic kidney disease, or unspecified chronic kidney disease: Secondary | ICD-10-CM | POA: Insufficient documentation

## 2016-08-28 DIAGNOSIS — Z23 Encounter for immunization: Secondary | ICD-10-CM | POA: Insufficient documentation

## 2016-08-28 DIAGNOSIS — I4891 Unspecified atrial fibrillation: Secondary | ICD-10-CM | POA: Insufficient documentation

## 2016-08-28 DIAGNOSIS — D638 Anemia in other chronic diseases classified elsewhere: Secondary | ICD-10-CM

## 2016-08-28 DIAGNOSIS — R970 Elevated carcinoembryonic antigen [CEA]: Secondary | ICD-10-CM | POA: Insufficient documentation

## 2016-08-28 DIAGNOSIS — M1990 Unspecified osteoarthritis, unspecified site: Secondary | ICD-10-CM | POA: Insufficient documentation

## 2016-08-28 DIAGNOSIS — D631 Anemia in chronic kidney disease: Secondary | ICD-10-CM | POA: Insufficient documentation

## 2016-08-28 DIAGNOSIS — I2510 Atherosclerotic heart disease of native coronary artery without angina pectoris: Secondary | ICD-10-CM | POA: Diagnosis not present

## 2016-08-28 DIAGNOSIS — D509 Iron deficiency anemia, unspecified: Secondary | ICD-10-CM

## 2016-08-28 DIAGNOSIS — F0390 Unspecified dementia without behavioral disturbance: Secondary | ICD-10-CM | POA: Diagnosis not present

## 2016-08-28 DIAGNOSIS — E7800 Pure hypercholesterolemia, unspecified: Secondary | ICD-10-CM | POA: Insufficient documentation

## 2016-08-28 DIAGNOSIS — Z79899 Other long term (current) drug therapy: Secondary | ICD-10-CM | POA: Insufficient documentation

## 2016-08-28 LAB — HEMOGLOBIN: HEMOGLOBIN: 8.9 g/dL — AB (ref 12.0–16.0)

## 2016-08-28 LAB — HEMATOCRIT: HCT: 29 % — ABNORMAL LOW (ref 35.0–47.0)

## 2016-08-28 MED ORDER — EPOETIN ALFA 40000 UNIT/ML IJ SOLN
40000.0000 [IU] | INTRAMUSCULAR | Status: DC
  Administered 2016-08-28: 40000 [IU] via SUBCUTANEOUS

## 2016-08-28 NOTE — Telephone Encounter (Signed)
spoke with patient's son in law. wife is at work. he asked that I call ginny @ work.  Left vm pt's daughter's work # asking daughter Donia Guiles to call our office regarding her mother.

## 2016-09-04 ENCOUNTER — Telehealth: Payer: Self-pay | Admitting: Internal Medicine

## 2016-09-04 ENCOUNTER — Inpatient Hospital Stay: Payer: Medicare Other

## 2016-09-04 VITALS — BP 113/56 | HR 58 | Resp 20

## 2016-09-04 DIAGNOSIS — D509 Iron deficiency anemia, unspecified: Secondary | ICD-10-CM

## 2016-09-04 DIAGNOSIS — D649 Anemia, unspecified: Secondary | ICD-10-CM

## 2016-09-04 DIAGNOSIS — D638 Anemia in other chronic diseases classified elsewhere: Secondary | ICD-10-CM

## 2016-09-04 DIAGNOSIS — I129 Hypertensive chronic kidney disease with stage 1 through stage 4 chronic kidney disease, or unspecified chronic kidney disease: Secondary | ICD-10-CM | POA: Diagnosis not present

## 2016-09-04 LAB — HEMATOCRIT: HEMATOCRIT: 28.9 % — AB (ref 35.0–47.0)

## 2016-09-04 LAB — HEMOGLOBIN: Hemoglobin: 8.7 g/dL — ABNORMAL LOW (ref 12.0–16.0)

## 2016-09-04 LAB — FERRITIN: Ferritin: 15 ng/mL (ref 11–307)

## 2016-09-04 MED ORDER — SODIUM CHLORIDE 0.9 % IV SOLN
510.0000 mg | Freq: Once | INTRAVENOUS | Status: AC
  Administered 2016-09-04: 510 mg via INTRAVENOUS
  Filled 2016-09-04: qty 17

## 2016-09-04 MED ORDER — SODIUM CHLORIDE 0.9 % IV SOLN
Freq: Once | INTRAVENOUS | Status: AC
  Administered 2016-09-04: 10:00:00 via INTRAVENOUS
  Filled 2016-09-04: qty 1000

## 2016-09-04 NOTE — Telephone Encounter (Signed)
Ginny (dtr) called to ask if they can get her mom's iron infusion today instead of tomorrow. She said her mom always displays specific symptoms when she is needing the infusion and they have been exhibited to the point that Tanya Townsend is hoping her mom can get the infusion today. Please advise.

## 2016-09-05 ENCOUNTER — Inpatient Hospital Stay: Payer: Medicare Other

## 2016-09-05 ENCOUNTER — Ambulatory Visit: Payer: Medicare Other

## 2016-09-05 VITALS — BP 144/74 | HR 66 | Resp 20

## 2016-09-05 DIAGNOSIS — D638 Anemia in other chronic diseases classified elsewhere: Secondary | ICD-10-CM

## 2016-09-05 DIAGNOSIS — D509 Iron deficiency anemia, unspecified: Secondary | ICD-10-CM

## 2016-09-05 DIAGNOSIS — I129 Hypertensive chronic kidney disease with stage 1 through stage 4 chronic kidney disease, or unspecified chronic kidney disease: Secondary | ICD-10-CM | POA: Diagnosis not present

## 2016-09-05 MED ORDER — EPOETIN ALFA 40000 UNIT/ML IJ SOLN
40000.0000 [IU] | INTRAMUSCULAR | Status: DC
  Administered 2016-09-05: 40000 [IU] via SUBCUTANEOUS

## 2016-09-11 ENCOUNTER — Inpatient Hospital Stay: Payer: Medicare Other

## 2016-09-11 ENCOUNTER — Other Ambulatory Visit: Payer: Self-pay | Admitting: Internal Medicine

## 2016-09-11 ENCOUNTER — Telehealth: Payer: Self-pay | Admitting: Internal Medicine

## 2016-09-11 VITALS — BP 123/65 | HR 64 | Temp 96.3°F | Resp 20

## 2016-09-11 DIAGNOSIS — I129 Hypertensive chronic kidney disease with stage 1 through stage 4 chronic kidney disease, or unspecified chronic kidney disease: Secondary | ICD-10-CM | POA: Diagnosis not present

## 2016-09-11 DIAGNOSIS — D509 Iron deficiency anemia, unspecified: Secondary | ICD-10-CM

## 2016-09-11 DIAGNOSIS — D638 Anemia in other chronic diseases classified elsewhere: Secondary | ICD-10-CM

## 2016-09-11 DIAGNOSIS — D649 Anemia, unspecified: Secondary | ICD-10-CM

## 2016-09-11 LAB — HEMOGLOBIN: Hemoglobin: 8.9 g/dL — ABNORMAL LOW (ref 12.0–16.0)

## 2016-09-11 LAB — HEMATOCRIT: HCT: 29.2 % — ABNORMAL LOW (ref 35.0–47.0)

## 2016-09-11 MED ORDER — EPOETIN ALFA 40000 UNIT/ML IJ SOLN
40000.0000 [IU] | INTRAMUSCULAR | Status: DC
  Administered 2016-09-11: 40000 [IU] via SUBCUTANEOUS

## 2016-09-11 NOTE — Telephone Encounter (Signed)
Patient's daughter Donia Guiles is very upset that no one was able to talk to her today about her mom's iron. She said she really needs to know whether or not her mom needs to come back this week or not because it will affect her work schedule. She wants someone to call her today. Please call. Thanks.

## 2016-09-11 NOTE — Telephone Encounter (Signed)
Patient and daughter was acknowledge today while in clinic. Results were provided to the daughter before procrit was administer.  Iron panels not drawn today as they were just drawn last week.  Patient's daughter insists that patient received IV fereheme tomorrow in clinic. Daughter reports that pt extremely weak

## 2016-09-12 ENCOUNTER — Inpatient Hospital Stay: Payer: Medicare Other

## 2016-09-12 VITALS — BP 114/68 | HR 61 | Temp 97.1°F | Resp 20

## 2016-09-12 DIAGNOSIS — D638 Anemia in other chronic diseases classified elsewhere: Secondary | ICD-10-CM

## 2016-09-12 DIAGNOSIS — I129 Hypertensive chronic kidney disease with stage 1 through stage 4 chronic kidney disease, or unspecified chronic kidney disease: Secondary | ICD-10-CM | POA: Diagnosis not present

## 2016-09-12 DIAGNOSIS — D509 Iron deficiency anemia, unspecified: Secondary | ICD-10-CM

## 2016-09-12 MED ORDER — SODIUM CHLORIDE 0.9 % IV SOLN
510.0000 mg | Freq: Once | INTRAVENOUS | Status: AC
  Administered 2016-09-12: 510 mg via INTRAVENOUS
  Filled 2016-09-12: qty 17

## 2016-09-12 MED ORDER — SODIUM CHLORIDE 0.9 % IV SOLN
Freq: Once | INTRAVENOUS | Status: AC
  Administered 2016-09-12: 09:00:00 via INTRAVENOUS
  Filled 2016-09-12: qty 1000

## 2016-09-12 NOTE — Telephone Encounter (Signed)
Spoke with md 9/19. md approved IV iron infusion on 9/20

## 2016-09-17 ENCOUNTER — Telehealth: Payer: Self-pay | Admitting: Internal Medicine

## 2016-09-17 NOTE — Telephone Encounter (Signed)
Yes, this is fine as long as patient does not have any contraindication to receiving influenza vaccine.

## 2016-09-17 NOTE — Telephone Encounter (Signed)
Ginny (pt dtr) would like her mom to get flu shot when she comes for MD appt tomorrow, if possible. Can we please add this to her appt? Thanks.

## 2016-09-18 ENCOUNTER — Inpatient Hospital Stay: Payer: Medicare Other

## 2016-09-18 ENCOUNTER — Inpatient Hospital Stay (HOSPITAL_BASED_OUTPATIENT_CLINIC_OR_DEPARTMENT_OTHER): Payer: Medicare Other | Admitting: Internal Medicine

## 2016-09-18 VITALS — BP 93/53 | HR 72 | Temp 96.6°F | Resp 16 | Ht 61.0 in | Wt 126.1 lb

## 2016-09-18 DIAGNOSIS — Z79899 Other long term (current) drug therapy: Secondary | ICD-10-CM | POA: Diagnosis not present

## 2016-09-18 DIAGNOSIS — D509 Iron deficiency anemia, unspecified: Secondary | ICD-10-CM

## 2016-09-18 DIAGNOSIS — I129 Hypertensive chronic kidney disease with stage 1 through stage 4 chronic kidney disease, or unspecified chronic kidney disease: Secondary | ICD-10-CM | POA: Diagnosis not present

## 2016-09-18 DIAGNOSIS — Z23 Encounter for immunization: Secondary | ICD-10-CM

## 2016-09-18 DIAGNOSIS — D649 Anemia, unspecified: Secondary | ICD-10-CM

## 2016-09-18 DIAGNOSIS — D638 Anemia in other chronic diseases classified elsewhere: Secondary | ICD-10-CM

## 2016-09-18 LAB — COMPREHENSIVE METABOLIC PANEL
ALBUMIN: 3.7 g/dL (ref 3.5–5.0)
ALK PHOS: 69 U/L (ref 38–126)
ALT: 16 U/L (ref 14–54)
ANION GAP: 8 (ref 5–15)
AST: 22 U/L (ref 15–41)
BILIRUBIN TOTAL: 0.5 mg/dL (ref 0.3–1.2)
BUN: 18 mg/dL (ref 6–20)
CALCIUM: 9.5 mg/dL (ref 8.9–10.3)
CO2: 28 mmol/L (ref 22–32)
CREATININE: 0.8 mg/dL (ref 0.44–1.00)
Chloride: 106 mmol/L (ref 101–111)
GFR calc Af Amer: >60 mL/min (ref >60)
GFR calc non Af Amer: >60 mL/min (ref >60)
GLUCOSE: 122 mg/dL — AB (ref 65–99)
Potassium: 3.8 mmol/L (ref 3.5–5.1)
Sodium: 142 mmol/L (ref 135–145)
TOTAL PROTEIN: 6 g/dL — AB (ref 6.5–8.1)

## 2016-09-18 LAB — CBC WITH DIFFERENTIAL/PLATELET
BASOS PCT: 1 %
Basophils Absolute: 0 10*3/uL (ref 0–0.1)
EOS PCT: 2 %
Eosinophils Absolute: 0.1 10*3/uL (ref 0–0.7)
HEMATOCRIT: 34.2 % — AB (ref 35.0–47.0)
HEMOGLOBIN: 10.5 g/dL — AB (ref 12.0–16.0)
LYMPHS PCT: 13 %
Lymphs Abs: 0.4 10*3/uL — ABNORMAL LOW (ref 1.0–3.6)
MCH: 27.6 pg (ref 26.0–34.0)
MCHC: 30.7 g/dL — AB (ref 32.0–36.0)
MCV: 89.9 fL (ref 80.0–100.0)
MONO ABS: 0.2 10*3/uL (ref 0.2–0.9)
Monocytes Relative: 7 %
Neutro Abs: 2.6 10*3/uL (ref 1.4–6.5)
Neutrophils Relative %: 77 %
Platelets: 213 10*3/uL (ref 150–440)
RBC: 3.81 MIL/uL (ref 3.80–5.20)
RDW: 31.8 % — ABNORMAL HIGH (ref 11.5–14.5)
WBC: 3.3 10*3/uL — AB (ref 3.6–11.0)

## 2016-09-18 LAB — IRON AND TIBC
Iron: 72 ug/dL (ref 28–170)
Saturation Ratios: 26 % (ref 10.4–31.8)
TIBC: 276 ug/dL (ref 250–450)
UIBC: 204 ug/dL

## 2016-09-18 LAB — FERRITIN: Ferritin: 301 ng/mL (ref 11–307)

## 2016-09-18 MED ORDER — INFLUENZA VAC SPLIT QUAD 0.5 ML IM SUSY
0.5000 mL | PREFILLED_SYRINGE | Freq: Once | INTRAMUSCULAR | Status: AC
  Administered 2016-09-18: 0.5 mL via INTRAMUSCULAR
  Filled 2016-09-18: qty 0.5

## 2016-09-18 NOTE — Progress Notes (Signed)
[facility] OFFICE PROGRESS NOTE  Patient Care Team: Tanya Mussel III, MD as PCP - General (Internal Medicine)   SUMMARY OF ONCOLOGIC HISTORY:  # 2009- Anemia- IRON DEF sec to duodenal AV Malformation [s/p cautery 2013; s/p Colo] s/p Ferraheme; CKD on PROCRIT q W  # Moderate Dementia/ Hx A.fib/ AS   INTERVAL HISTORY: Poor historian/dementia; most of the history is taken talking to her daughter.  A very pleasant [name] patient with long-standing history of anemia and also moderate dementia is here for follow-up of her anemia. Patient received IV iron one week ago.  As per the daughter, patient has no new shortness of breath. Her appetite is good. As per the daughter no blood in stools or black stools; no nausea no vomiting abdominal pain. Weight is steady.  REVIEW OF SYSTEMS:  Difficult to assess given her dementia.  PAST MEDICAL HISTORY :  Past Medical History:  Diagnosis Date  . Anxiety   . Aortic sclerosis ([place])   . AV malformation of gastrointestinal tract    AV malformations of the duodenum  . Coronary artery disease   . Dementia   . DM (diabetes mellitus) ([place])   . Elevated carcinoembryonic antigen (CEA)   . Glaucoma   . H/O bone scan 04/27/2011  . History of atrial fibrillation   . History of esophageal stricture   . History of MI (myocardial infarction)   . HTN (hypertension)   . Hypercholesteremia   . IDA (iron deficiency anemia)   . Osteoarthritis   . Osteoarthritis   . Osteoporosis   . Pulmonary nodules   . Stroke [facility])     PAST SURGICAL HISTORY :   Past Surgical History:  Procedure Laterality Date  . APPENDECTOMY    . COLONOSCOPY    . Endovascular Angioplasty Femoral/Popliteal Artery W/Stent/Atherectomy  1990  . POLYPECTOMY    . THYROIDECTOMY, PARTIAL    . TONSILLECTOMY    . UPPER GASTROINTESTINAL ENDOSCOPY  2015    FAMILY HISTORY :   Family History  Problem Relation Age of Onset  . Coronary artery disease  Mother   . Diabetes Mellitus II Mother   . Heart attack Father   . Heart attack Sister   . Aortic aneurysm Sister   . Prostate cancer Brother     brother #1  . Heart attack Brother     brother #1  . Prostate cancer Brother     brother #2  . Heart attack Brother     brother #2  . Heart attack Brother     brother #3    SOCIAL HISTORY:   Social History  Substance Use Topics  . Smoking status: Former Smoker    Packs/day: 0.50    Years: 40.00    Types: Cigarettes  . Smokeless tobacco: Never Used     Comment: quit in 1991  . Alcohol use No     Comment: previous ETOH abuse-stopped 3 years ago    ALLERGIES:  is allergic to penicillin g; sulfa antibiotics; ace inhibitors; and penicillin v potassium.  MEDICATIONS:  Current Outpatient Prescriptions  Medication Sig Dispense Refill  . acetaminophen (TYLENOL) 325 MG tablet Take 650 mg by mouth.    Marland Kitchen amLODipine (NORVASC) 5 MG tablet Take 5 mg by mouth daily.  3  . atorvastatin (LIPITOR) 20 MG tablet Take 20 mg by mouth.    . Calcium Carb-Cholecalciferol 600-800 MG-UNIT CHEW Chew 1 tablet by mouth at bedtime.    . donepezil (  ARICEPT) 10 MG tablet Take 10 mg by mouth at bedtime.  6  . isosorbide mononitrate (IMDUR) 30 MG 24 hr tablet Take by mouth.    . metoprolol succinate (TOPROL-XL) 100 MG 24 hr tablet Take 100 mg by mouth daily.  3  . Multiple Vitamins-Minerals (CENTRUM SILVER PO) Take 1 tablet by mouth 1 day or 1 dose.    . Multiple Vitamins-Minerals (PRESERVISION AREDS) TABS Take 1 tablet by mouth 2 (two) times daily.     Marland Kitchen omeprazole (PRILOSEC) 10 MG capsule Take 20 mg by mouth.    . nitroGLYCERIN (NITROSTAT) 0.4 MG SL tablet Place under the tongue.     No current facility-administered medications for this visit.    Facility-Administered Medications Ordered in Other Visits  Medication Dose Route Frequency Provider Last Rate Last Dose  . 0.9 %  sodium chloride infusion  250 mL Intravenous Once Evlyn Kanner, NP      .  acetaminophen (TYLENOL) tablet 650 mg  650 mg Oral Once Evlyn Kanner, NP      . diphenhydrAMINE (BENADRYL) capsule 25 mg  25 mg Oral Once Evlyn Kanner, NP        PHYSICAL EXAMINATION: ECOG PERFORMANCE STATUS: 1 - Symptomatic but completely ambulatory  BP (!) 93/53 (BP Location: Right Arm, Patient Position: Sitting)   Pulse 72   Temp (!) 96.6 F (35.9 C) (Tympanic)   Resp 16   Ht 5\' 1"  (1.549 m)   Wt 126 lb 1.7 oz (57.2 kg)   BMI 23.83 kg/m   Filed Weights   09/18/16 0946  Weight: 126 lb 1.7 oz (57.2 kg)    GENERAL: Well-nourished well-developed; Alert, no distress and comfortable.   Accompanied by her daughter. EYES: positive for pallor OROPHARYNX: no thrush or ulceration. Bilateral ear exam- cerumen buildup bilaterally NECK: supple, no masses felt LYMPH:  no palpable lymphadenopathy in the cervical, axillary or inguinal regions LUNGS: Clear to auscultation bilaterally. No significant crackles noted HEART/CVS: regular rate & rhythm and positive for systolic murmur; No lower extremity edema ABDOMEN:abdomen soft, non-tender and normal bowel sounds Musculoskeletal:no cyanosis of digits and no clubbing  PSYCH: alert & oriented x 1-2.  NEURO: no focal motor/sensory deficits SKIN:  no rashes or significant lesions  LABORATORY DATA:  I have reviewed the data as listed    Component Value Date/Time   NA 142 09/18/2016 0857   NA 140 04/19/2015 0852   K 3.8 09/18/2016 0857   K 3.6 04/19/2015 0852   CL 106 09/18/2016 0857   CL 102 04/19/2015 0852   CO2 28 09/18/2016 0857   CO2 29 04/19/2015 0852   GLUCOSE 122 (H) 09/18/2016 0857   GLUCOSE 118 (H) 04/19/2015 0852   BUN 18 09/18/2016 0857   BUN 17 04/19/2015 0852   CREATININE 0.80 09/18/2016 0857   CREATININE 0.83 04/19/2015 0852   CALCIUM 9.5 09/18/2016 0857   CALCIUM 9.3 04/19/2015 0852   PROT 6.0 (L) 09/18/2016 0857   PROT 6.3 (L) 04/19/2015 0852   ALBUMIN 3.7 09/18/2016 0857   ALBUMIN 3.5 04/19/2015 0852   AST  22 09/18/2016 0857   AST 30 04/19/2015 0852   ALT 16 09/18/2016 0857   ALT 26 04/19/2015 0852   ALKPHOS 69 09/18/2016 0857   ALKPHOS 71 04/19/2015 0852   BILITOT 0.5 09/18/2016 0857   BILITOT 0.4 04/19/2015 0852   GFRNONAA >60 09/18/2016 0857   GFRNONAA >60 04/19/2015 0852   GFRAA >60 09/18/2016 0857   GFRAA >60 04/19/2015  [id number]    No results found for: SPEP, UPEP  Lab Results  Component Value Date   WBC 3.3 (L) 09/18/2016   NEUTROABS 2.6 09/18/2016   HGB 10.5 (L) 09/18/2016   HCT 34.2 (L) 09/18/2016   MCV 89.9 09/18/2016   PLT 213 09/18/2016      Chemistry      Component Value Date/Time   NA 142 09/18/2016 0857   NA 140 04/19/2015 0852   K 3.8 09/18/2016 0857   K 3.6 04/19/2015 0852   CL 106 09/18/2016 0857   CL 102 04/19/2015 0852   CO2 28 09/18/2016 0857   CO2 29 04/19/2015 0852   BUN 18 09/18/2016 0857   BUN 17 04/19/2015 0852   CREATININE 0.80 09/18/2016 0857   CREATININE 0.83 04/19/2015 0852      Component Value Date/Time   CALCIUM 9.5 09/18/2016 0857   CALCIUM 9.3 04/19/2015 0852   ALKPHOS 69 09/18/2016 0857   ALKPHOS 71 04/19/2015 0852   AST 22 09/18/2016 0857   AST 30 04/19/2015 0852   ALT 16 09/18/2016 0857   ALT 26 04/19/2015 0852   BILITOT 0.5 09/18/2016 0857   BILITOT 0.4 04/19/2015 0852        ASSESSMENT & PLAN:   Anemia, iron deficiency # Anemia- MDS/iron def Anemia versus other causes currently on Procrit 40,000 units on a weekly basis. Today hemoglobin is 10.5. HOLD procrit today. S/p IV Ferrahem x 2 last week.   # Weekly H&H/Procrit; follow-up with me in 3 months with a CBC CMP and iron studies.  # okay with flu shot today.      Cammie Sickle, MD 09/18/2016 5:31 PM

## 2016-09-18 NOTE — Progress Notes (Signed)
Per pt daughter every now and again pt complains of chest pain.

## 2016-09-18 NOTE — Assessment & Plan Note (Signed)
#   Anemia- MDS/iron def Anemia versus other causes currently on Procrit 40,000 units on a weekly basis. Today hemoglobin is 10.5. HOLD procrit today. S/p IV Ferrahem x 2 last week.   # Weekly H&H/Procrit; follow-up with me in 3 months with a CBC CMP and iron studies.  # okay with flu shot today.

## 2016-09-21 IMAGING — CR DG RIBS W/ CHEST 3+V*L*
4 series · 4 of 4 positions shown · non-contrast
Comparison: Single view of the chest 03/05/2012. PA and lateral
chest 02/22/2012.

CLINICAL DATA: Status post trip and fall at home 01/22/2016 with
continued left chest pain. Initial encounter.

EXAM:
LEFT RIBS AND CHEST - 3+ VIEW

[chest pa]
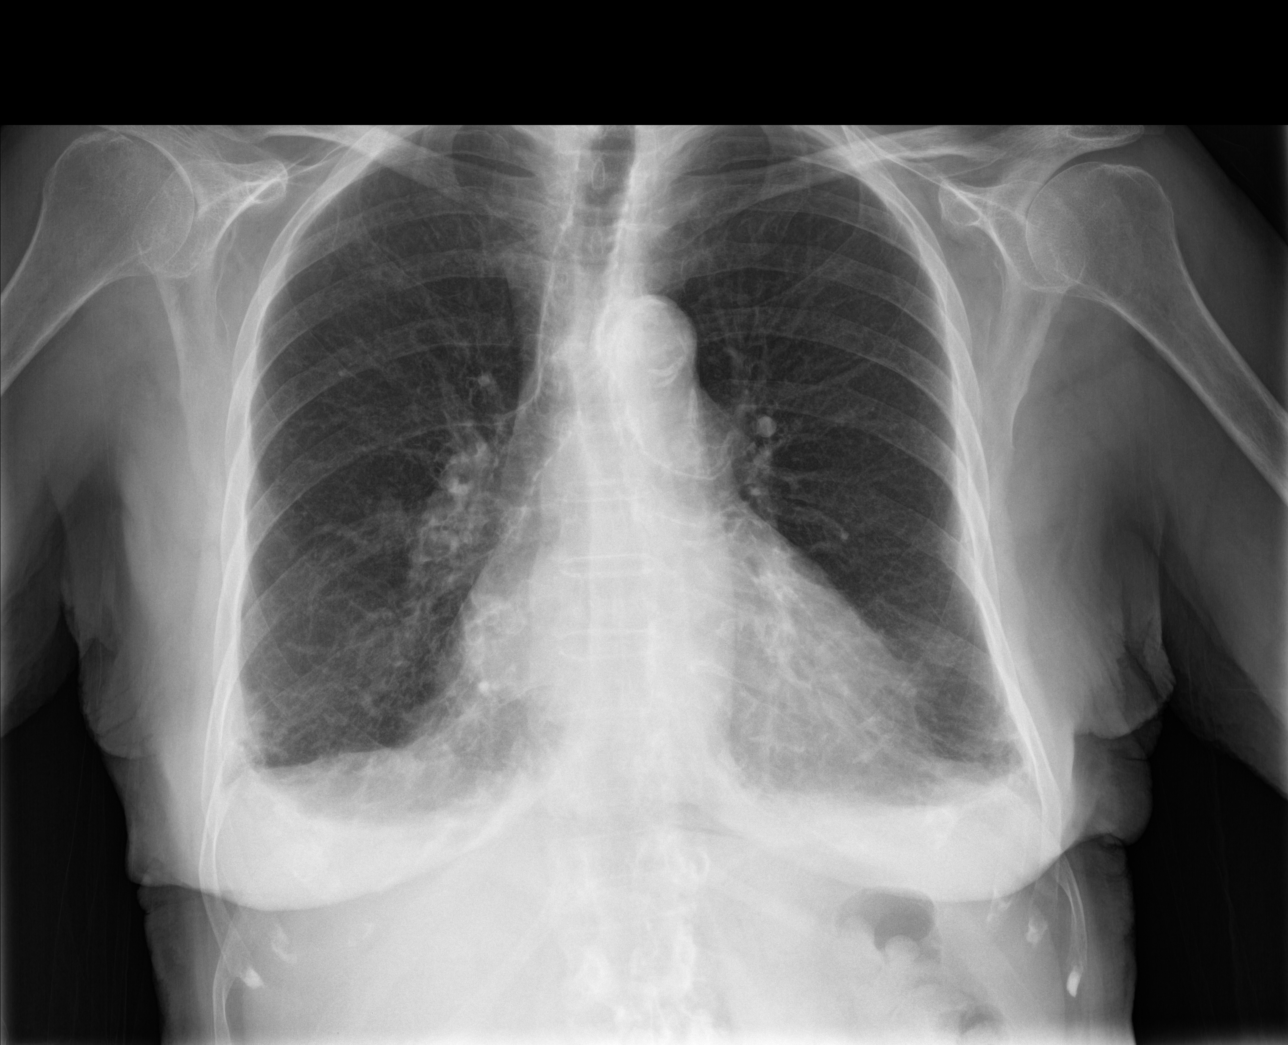

[rib obl (1 of 2)]
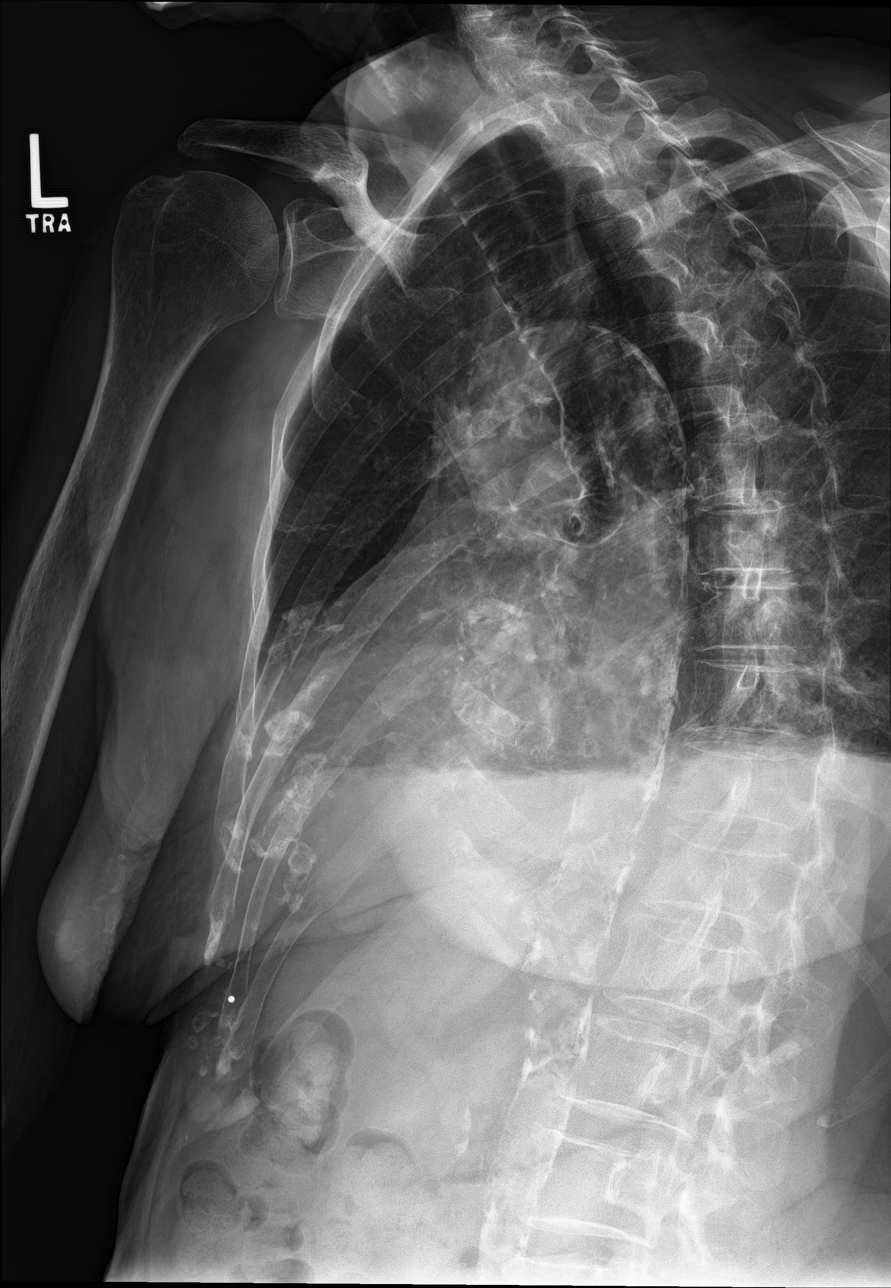

[rib obl (2 of 2)]
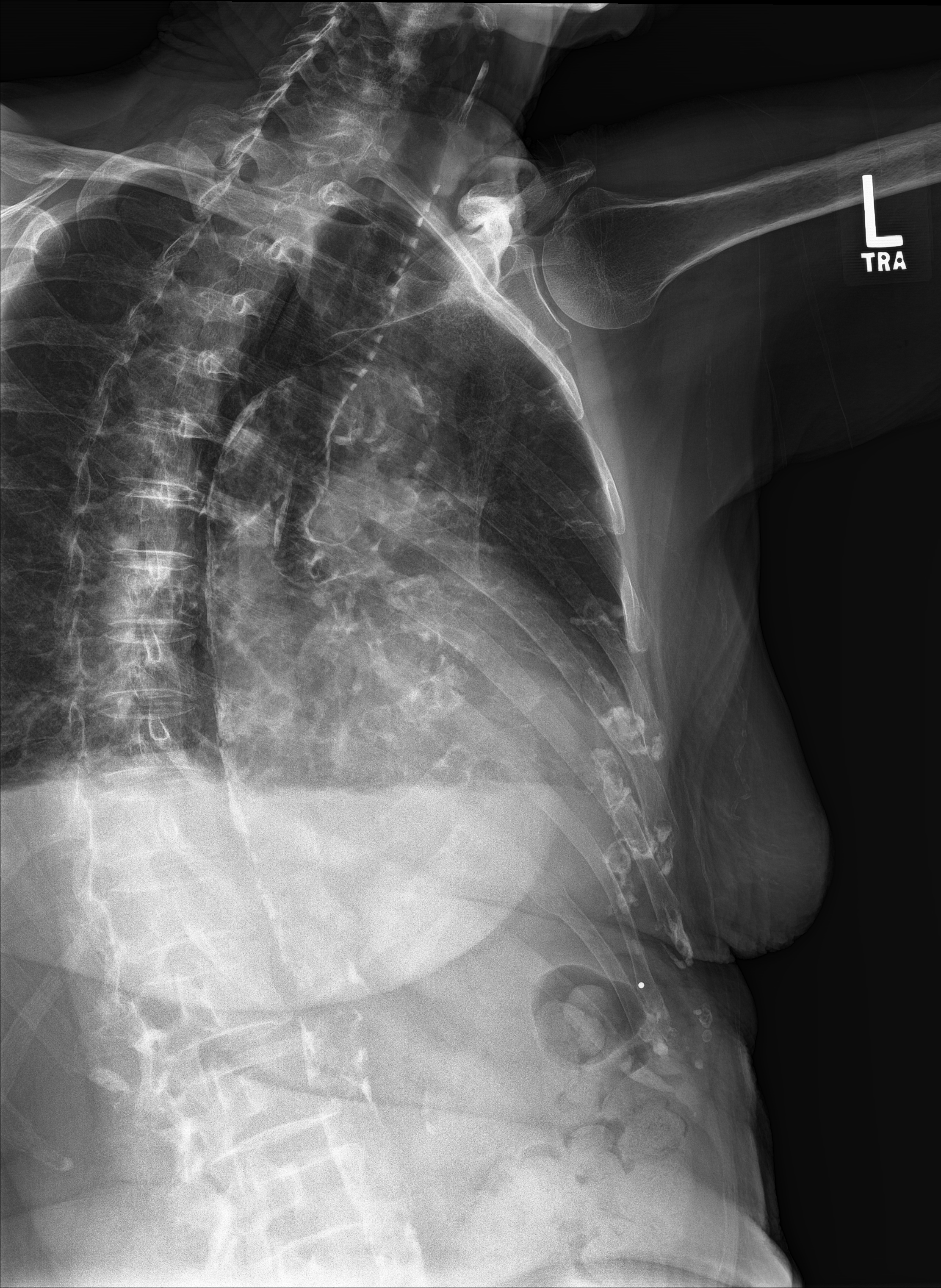

[rib pa]
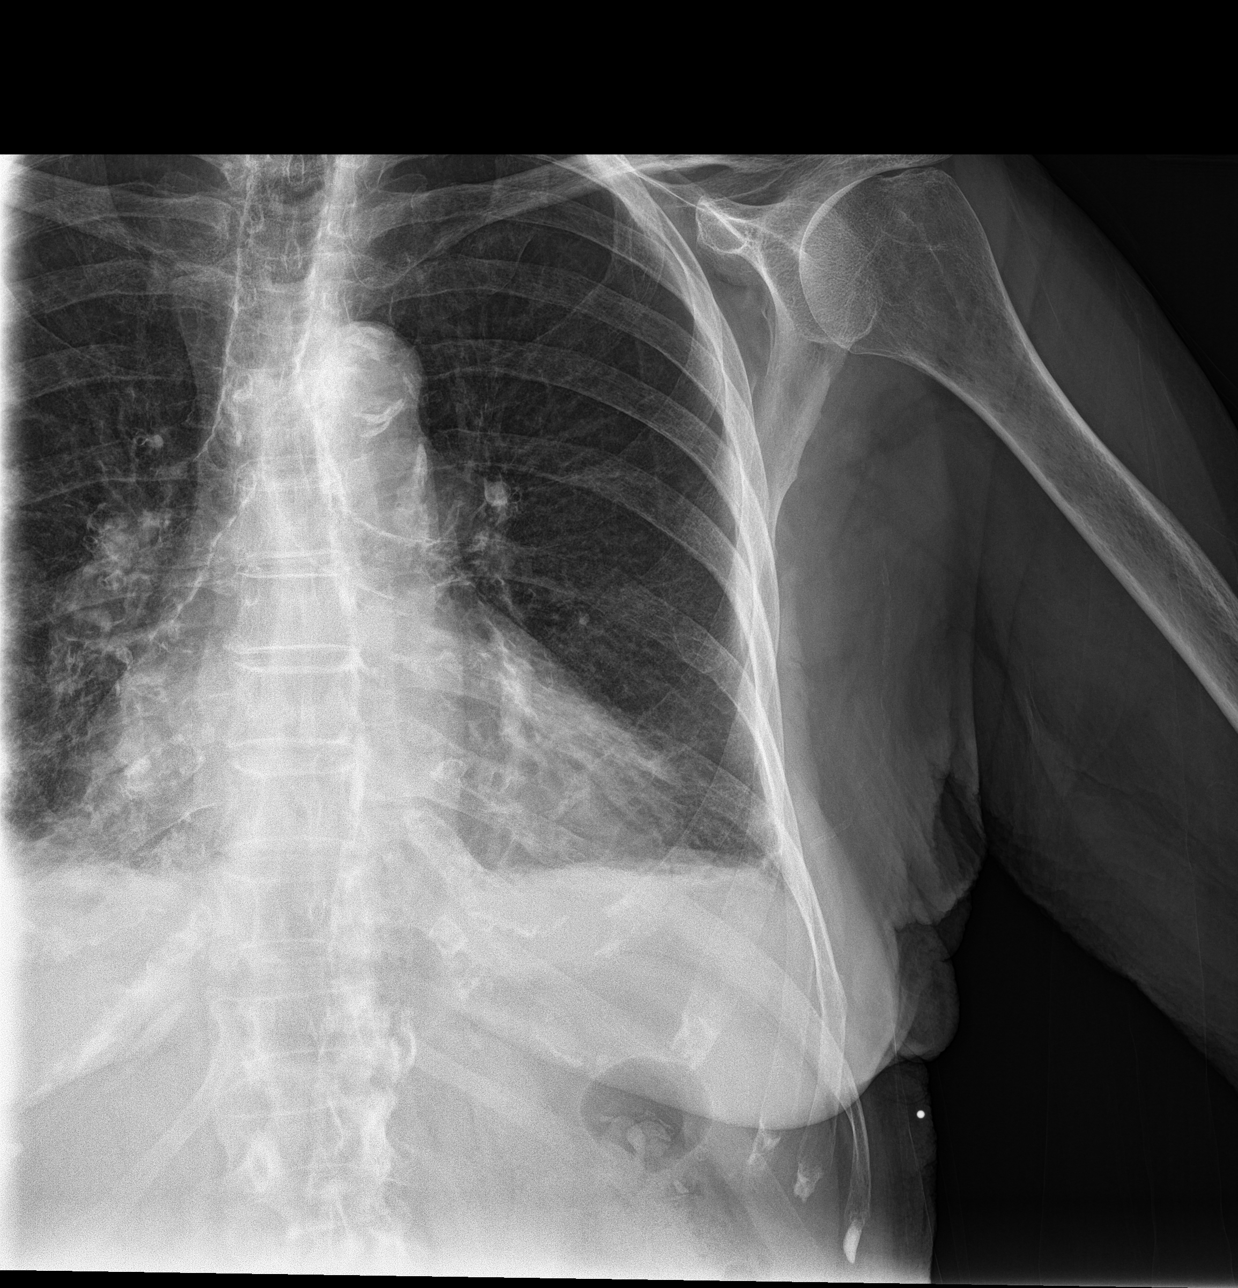

[4 of 4 positions shown; findings below may reference images not displayed]

FINDINGS: There small bilateral pleural effusions and mild basilar
atelectasis. No pneumothorax. Heart size is enlarged.
Atherosclerosis is noted. The patient has nondisplaced fractures of
the left fifth and sixth ribs.
IMPRESSION: Acute nondisplaced fractures left fifth and sixth ribs. Negative for
pneumothorax.

Small bilateral pleural effusions and basilar atelectasis.

Cardiomegaly without edema.

Atherosclerosis.

## 2016-09-25 ENCOUNTER — Inpatient Hospital Stay: Payer: Medicare Other | Attending: Internal Medicine

## 2016-09-25 ENCOUNTER — Inpatient Hospital Stay: Payer: Medicare Other

## 2016-09-25 DIAGNOSIS — Z79899 Other long term (current) drug therapy: Secondary | ICD-10-CM | POA: Diagnosis not present

## 2016-09-25 DIAGNOSIS — D638 Anemia in other chronic diseases classified elsewhere: Secondary | ICD-10-CM

## 2016-09-25 DIAGNOSIS — D509 Iron deficiency anemia, unspecified: Secondary | ICD-10-CM | POA: Diagnosis not present

## 2016-09-25 LAB — HEMOGLOBIN: HEMOGLOBIN: 12.6 g/dL (ref 12.0–16.0)

## 2016-09-25 LAB — HEMATOCRIT: HCT: 40.3 % (ref 35.0–47.0)

## 2016-10-02 ENCOUNTER — Inpatient Hospital Stay: Payer: Medicare Other

## 2016-10-02 DIAGNOSIS — D638 Anemia in other chronic diseases classified elsewhere: Secondary | ICD-10-CM

## 2016-10-02 DIAGNOSIS — D509 Iron deficiency anemia, unspecified: Secondary | ICD-10-CM | POA: Diagnosis not present

## 2016-10-02 LAB — HEMOGLOBIN: Hemoglobin: 12.3 g/dL (ref 12.0–16.0)

## 2016-10-02 LAB — HEMATOCRIT: HEMATOCRIT: 38.9 % (ref 35.0–47.0)

## 2016-10-09 ENCOUNTER — Inpatient Hospital Stay: Payer: Medicare Other

## 2016-10-09 DIAGNOSIS — D638 Anemia in other chronic diseases classified elsewhere: Secondary | ICD-10-CM

## 2016-10-09 DIAGNOSIS — D509 Iron deficiency anemia, unspecified: Secondary | ICD-10-CM | POA: Diagnosis not present

## 2016-10-09 LAB — HEMOGLOBIN: Hemoglobin: 12.9 g/dL (ref 12.0–16.0)

## 2016-10-09 LAB — HEMATOCRIT: HCT: 39.5 % (ref 35.0–47.0)

## 2016-10-16 ENCOUNTER — Inpatient Hospital Stay: Payer: Medicare Other | Attending: Internal Medicine

## 2016-10-16 ENCOUNTER — Inpatient Hospital Stay: Payer: Medicare Other

## 2016-10-16 DIAGNOSIS — D509 Iron deficiency anemia, unspecified: Secondary | ICD-10-CM | POA: Diagnosis not present

## 2016-10-16 DIAGNOSIS — D638 Anemia in other chronic diseases classified elsewhere: Secondary | ICD-10-CM

## 2016-10-16 LAB — HEMATOCRIT: HCT: 38.2 % (ref 35.0–47.0)

## 2016-10-16 LAB — HEMOGLOBIN: HEMOGLOBIN: 12.4 g/dL (ref 12.0–16.0)

## 2016-10-23 ENCOUNTER — Inpatient Hospital Stay: Payer: Medicare Other

## 2016-10-23 ENCOUNTER — Telehealth: Payer: Self-pay | Admitting: *Deleted

## 2016-10-23 ENCOUNTER — Telehealth: Payer: Self-pay | Admitting: Internal Medicine

## 2016-10-23 DIAGNOSIS — D509 Iron deficiency anemia, unspecified: Secondary | ICD-10-CM | POA: Diagnosis not present

## 2016-10-23 DIAGNOSIS — D638 Anemia in other chronic diseases classified elsewhere: Secondary | ICD-10-CM

## 2016-10-23 LAB — HEMOGLOBIN: HEMOGLOBIN: 12.4 g/dL (ref 12.0–16.0)

## 2016-10-23 LAB — HEMATOCRIT: HEMATOCRIT: 37.6 % (ref 35.0–47.0)

## 2016-10-23 NOTE — Telephone Encounter (Signed)
Returned patient's daughter phone call regarding her intake of chocolate. Advised daughter to consult PCP for dietary guidance.

## 2016-10-23 NOTE — Telephone Encounter (Signed)
Pt is eating a lot of chocolate but doesn't want much else. She does have one or two Boosts per day. They want to know if it is okay for her to be eating so much candy. Please call and advise. Thanks.

## 2016-10-25 NOTE — Telephone Encounter (Signed)
Recommend limiting chocolate intake; for further recommendations follow up with PCP.  Please inform pt/daughter. Thx

## 2016-10-30 ENCOUNTER — Inpatient Hospital Stay: Payer: Medicare Other

## 2016-10-30 ENCOUNTER — Inpatient Hospital Stay: Payer: Medicare Other | Attending: Internal Medicine

## 2016-10-30 DIAGNOSIS — D638 Anemia in other chronic diseases classified elsewhere: Secondary | ICD-10-CM

## 2016-10-30 DIAGNOSIS — Z79899 Other long term (current) drug therapy: Secondary | ICD-10-CM | POA: Diagnosis not present

## 2016-10-30 DIAGNOSIS — D509 Iron deficiency anemia, unspecified: Secondary | ICD-10-CM | POA: Diagnosis not present

## 2016-10-30 LAB — HEMATOCRIT: HEMATOCRIT: 35.8 % (ref 35.0–47.0)

## 2016-10-30 LAB — HEMOGLOBIN: HEMOGLOBIN: 11.9 g/dL — AB (ref 12.0–16.0)

## 2016-11-06 ENCOUNTER — Inpatient Hospital Stay: Payer: Medicare Other

## 2016-11-06 DIAGNOSIS — D638 Anemia in other chronic diseases classified elsewhere: Secondary | ICD-10-CM

## 2016-11-06 DIAGNOSIS — D509 Iron deficiency anemia, unspecified: Secondary | ICD-10-CM | POA: Diagnosis not present

## 2016-11-06 LAB — HEMATOCRIT: HCT: 35.6 % (ref 35.0–47.0)

## 2016-11-06 LAB — HEMOGLOBIN: Hemoglobin: 11.8 g/dL — ABNORMAL LOW (ref 12.0–16.0)

## 2016-11-13 ENCOUNTER — Inpatient Hospital Stay: Payer: Medicare Other

## 2016-11-13 DIAGNOSIS — D638 Anemia in other chronic diseases classified elsewhere: Secondary | ICD-10-CM

## 2016-11-13 DIAGNOSIS — D509 Iron deficiency anemia, unspecified: Secondary | ICD-10-CM | POA: Diagnosis not present

## 2016-11-13 LAB — HEMATOCRIT: HEMATOCRIT: 32.2 % — AB (ref 35.0–47.0)

## 2016-11-13 LAB — HEMOGLOBIN: Hemoglobin: 10.6 g/dL — ABNORMAL LOW (ref 12.0–16.0)

## 2016-11-20 ENCOUNTER — Inpatient Hospital Stay: Payer: Medicare Other

## 2016-11-20 VITALS — BP 131/70 | HR 57 | Temp 95.8°F | Resp 20

## 2016-11-20 DIAGNOSIS — D509 Iron deficiency anemia, unspecified: Secondary | ICD-10-CM

## 2016-11-20 DIAGNOSIS — D638 Anemia in other chronic diseases classified elsewhere: Secondary | ICD-10-CM

## 2016-11-20 LAB — HEMOGLOBIN: HEMOGLOBIN: 10.3 g/dL — AB (ref 12.0–16.0)

## 2016-11-20 LAB — HEMATOCRIT: HEMATOCRIT: 31.5 % — AB (ref 35.0–47.0)

## 2016-11-20 MED ORDER — SODIUM CHLORIDE 0.9 % IV SOLN
510.0000 mg | Freq: Once | INTRAVENOUS | Status: AC
  Administered 2016-11-20: 510 mg via INTRAVENOUS
  Filled 2016-11-20: qty 17

## 2016-11-20 MED ORDER — SODIUM CHLORIDE 0.9 % IV SOLN
Freq: Once | INTRAVENOUS | Status: AC
  Administered 2016-11-20: 10:00:00 via INTRAVENOUS
  Filled 2016-11-20: qty 1000

## 2016-11-21 ENCOUNTER — Ambulatory Visit: Payer: Medicare Other

## 2016-11-27 ENCOUNTER — Inpatient Hospital Stay: Payer: Medicare Other

## 2016-11-27 ENCOUNTER — Inpatient Hospital Stay: Payer: Medicare Other | Attending: Internal Medicine

## 2016-11-27 DIAGNOSIS — N189 Chronic kidney disease, unspecified: Secondary | ICD-10-CM | POA: Diagnosis not present

## 2016-11-27 DIAGNOSIS — D469 Myelodysplastic syndrome, unspecified: Secondary | ICD-10-CM | POA: Diagnosis not present

## 2016-11-27 DIAGNOSIS — Z79899 Other long term (current) drug therapy: Secondary | ICD-10-CM | POA: Insufficient documentation

## 2016-11-27 DIAGNOSIS — D638 Anemia in other chronic diseases classified elsewhere: Secondary | ICD-10-CM

## 2016-11-27 DIAGNOSIS — D649 Anemia, unspecified: Secondary | ICD-10-CM | POA: Insufficient documentation

## 2016-11-27 LAB — HEMATOCRIT: HCT: 32.1 % — ABNORMAL LOW (ref 35.0–47.0)

## 2016-11-27 LAB — HEMOGLOBIN: Hemoglobin: 10.4 g/dL — ABNORMAL LOW (ref 12.0–16.0)

## 2016-12-04 ENCOUNTER — Inpatient Hospital Stay: Payer: Medicare Other

## 2016-12-04 VITALS — BP 119/50 | HR 64 | Temp 97.4°F | Resp 18

## 2016-12-04 DIAGNOSIS — D638 Anemia in other chronic diseases classified elsewhere: Secondary | ICD-10-CM

## 2016-12-04 DIAGNOSIS — D649 Anemia, unspecified: Secondary | ICD-10-CM | POA: Diagnosis not present

## 2016-12-04 DIAGNOSIS — D509 Iron deficiency anemia, unspecified: Secondary | ICD-10-CM

## 2016-12-04 LAB — HEMATOCRIT: HEMATOCRIT: 30 % — AB (ref 35.0–47.0)

## 2016-12-04 LAB — HEMOGLOBIN: HEMOGLOBIN: 9.7 g/dL — AB (ref 12.0–16.0)

## 2016-12-04 MED ORDER — FERUMOXYTOL INJECTION 510 MG/17 ML
510.0000 mg | Freq: Once | INTRAVENOUS | Status: AC
  Administered 2016-12-04: 510 mg via INTRAVENOUS
  Filled 2016-12-04: qty 17

## 2016-12-04 NOTE — Patient Instructions (Signed)
Ferumoxytol injection What is this medicine? FERUMOXYTOL is an iron complex. Iron is used to make healthy red blood cells, which carry oxygen and nutrients throughout the body. This medicine is used to treat iron deficiency anemia in people with chronic kidney disease. COMMON BRAND NAME(S): Feraheme What should I tell my health care provider before I take this medicine? They need to know if you have any of these conditions: -anemia not caused by low iron levels -high levels of iron in the blood -magnetic resonance imaging (MRI) test scheduled -an unusual or allergic reaction to iron, other medicines, foods, dyes, or preservatives -pregnant or trying to get pregnant -breast-feeding How should I use this medicine? This medicine is for injection into a vein. It is given by a health care professional in a hospital or clinic setting. Talk to your pediatrician regarding the use of this medicine in children. Special care may be needed. What if I miss a dose? It is important not to miss your dose. Call your doctor or health care professional if you are unable to keep an appointment. What may interact with this medicine? This medicine may interact with the following medications: -other iron products What should I watch for while using this medicine? Visit your doctor or healthcare professional regularly. Tell your doctor or healthcare professional if your symptoms do not start to get better or if they get worse. You may need blood work done while you are taking this medicine. You may need to follow a special diet. Talk to your doctor. Foods that contain iron include: whole grains/cereals, dried fruits, beans, or peas, leafy green vegetables, and organ meats (liver, kidney). What side effects may I notice from receiving this medicine? Side effects that you should report to your doctor or health care professional as soon as possible: -allergic reactions like skin rash, itching or hives, swelling of the  face, lips, or tongue -breathing problems -changes in blood pressure -feeling faint or lightheaded, falls -fever or chills -flushing, sweating, or hot feelings -swelling of the ankles or feet Side effects that usually do not require medical attention (report to your doctor or health care professional if they continue or are bothersome): -diarrhea -headache -nausea, vomiting -stomach pain Where should I keep my medicine? This drug is given in a hospital or clinic and will not be stored at home.  2017 Elsevier/Gold Standard (2016-01-12 12:41:49)  

## 2016-12-11 ENCOUNTER — Inpatient Hospital Stay: Payer: Medicare Other

## 2016-12-11 ENCOUNTER — Ambulatory Visit: Payer: Medicare Other | Admitting: Internal Medicine

## 2016-12-11 DIAGNOSIS — D638 Anemia in other chronic diseases classified elsewhere: Secondary | ICD-10-CM

## 2016-12-11 DIAGNOSIS — D649 Anemia, unspecified: Secondary | ICD-10-CM | POA: Diagnosis not present

## 2016-12-11 LAB — COMPREHENSIVE METABOLIC PANEL
ALK PHOS: 81 U/L (ref 38–126)
ALT: 17 U/L (ref 14–54)
AST: 23 U/L (ref 15–41)
Albumin: 3.5 g/dL (ref 3.5–5.0)
Anion gap: 7 (ref 5–15)
BILIRUBIN TOTAL: 0.5 mg/dL (ref 0.3–1.2)
BUN: 22 mg/dL — AB (ref 6–20)
CALCIUM: 9.5 mg/dL (ref 8.9–10.3)
CHLORIDE: 105 mmol/L (ref 101–111)
CO2: 28 mmol/L (ref 22–32)
CREATININE: 0.9 mg/dL (ref 0.44–1.00)
GFR, EST NON AFRICAN AMERICAN: 54 mL/min — AB (ref >60)
Glucose, Bld: 153 mg/dL — ABNORMAL HIGH (ref 65–99)
Potassium: 3.8 mmol/L (ref 3.5–5.1)
Sodium: 140 mmol/L (ref 135–145)
TOTAL PROTEIN: 6.3 g/dL — AB (ref 6.5–8.1)

## 2016-12-11 LAB — CBC WITH DIFFERENTIAL/PLATELET
Basophils Absolute: 0 10*3/uL (ref 0–0.1)
Basophils Relative: 1 %
EOS PCT: 3 %
Eosinophils Absolute: 0.1 10*3/uL (ref 0–0.7)
HEMATOCRIT: 32.5 % — AB (ref 35.0–47.0)
Hemoglobin: 10.5 g/dL — ABNORMAL LOW (ref 12.0–16.0)
LYMPHS ABS: 0.5 10*3/uL — AB (ref 1.0–3.6)
LYMPHS PCT: 10 %
MCH: 32.1 pg (ref 26.0–34.0)
MCHC: 32.5 g/dL (ref 32.0–36.0)
MCV: 98.9 fL (ref 80.0–100.0)
Monocytes Absolute: 0.3 10*3/uL (ref 0.2–0.9)
Monocytes Relative: 7 %
NEUTROS ABS: 3.5 10*3/uL (ref 1.4–6.5)
Neutrophils Relative %: 79 %
PLATELETS: 233 10*3/uL (ref 150–440)
RBC: 3.28 MIL/uL — AB (ref 3.80–5.20)
RDW: 20.8 % — ABNORMAL HIGH (ref 11.5–14.5)
WBC: 4.5 10*3/uL (ref 3.6–11.0)

## 2016-12-11 LAB — IRON AND TIBC
IRON: 62 ug/dL (ref 28–170)
Saturation Ratios: 24 % (ref 10.4–31.8)
TIBC: 258 ug/dL (ref 250–450)
UIBC: 196 ug/dL

## 2016-12-11 LAB — FERRITIN: FERRITIN: 293 ng/mL (ref 11–307)

## 2016-12-18 ENCOUNTER — Inpatient Hospital Stay: Payer: Medicare Other

## 2016-12-19 ENCOUNTER — Inpatient Hospital Stay: Payer: Medicare Other

## 2016-12-19 VITALS — BP 121/69 | HR 79 | Temp 97.9°F | Resp 18

## 2016-12-19 DIAGNOSIS — D638 Anemia in other chronic diseases classified elsewhere: Secondary | ICD-10-CM

## 2016-12-19 DIAGNOSIS — D649 Anemia, unspecified: Secondary | ICD-10-CM | POA: Diagnosis not present

## 2016-12-19 DIAGNOSIS — D509 Iron deficiency anemia, unspecified: Secondary | ICD-10-CM

## 2016-12-19 LAB — HEMATOCRIT: HEMATOCRIT: 30.2 % — AB (ref 35.0–47.0)

## 2016-12-19 LAB — HEMOGLOBIN: HEMOGLOBIN: 9.8 g/dL — AB (ref 12.0–16.0)

## 2016-12-19 MED ORDER — EPOETIN ALFA 40000 UNIT/ML IJ SOLN
40000.0000 [IU] | INTRAMUSCULAR | Status: DC
  Administered 2016-12-19: 40000 [IU] via SUBCUTANEOUS

## 2016-12-19 NOTE — Patient Instructions (Signed)
Epoetin Alfa injection °What is this medicine? °EPOETIN ALFA (e POE e tin AL fa) helps your body make more red blood cells. This medicine is used to treat anemia caused by chronic kidney failure, cancer chemotherapy, or HIV-therapy. It may also be used before surgery if you have anemia. °This medicine may be used for other purposes; ask your health care provider or pharmacist if you have questions. °COMMON BRAND NAME(S): Epogen, Procrit °What should I tell my health care provider before I take this medicine? °They need to know if you have any of these conditions: °-blood clotting disorders °-cancer patient not on chemotherapy °-cystic fibrosis °-heart disease, such as angina or heart failure °-hemoglobin level of 12 g/dL or greater °-high blood pressure °-low levels of folate, iron, or vitamin B12 °-seizures °-an unusual or allergic reaction to erythropoietin, albumin, benzyl alcohol, hamster proteins, other medicines, foods, dyes, or preservatives °-pregnant or trying to get pregnant °-breast-feeding °How should I use this medicine? °This medicine is for injection into a vein or under the skin. It is usually given by a health care professional in a hospital or clinic setting. °If you get this medicine at home, you will be taught how to prepare and give this medicine. Use exactly as directed. Take your medicine at regular intervals. Do not take your medicine more often than directed. °It is important that you put your used needles and syringes in a special sharps container. Do not put them in a trash can. If you do not have a sharps container, call your pharmacist or healthcare provider to get one. °A special MedGuide will be given to you by the pharmacist with each prescription and refill. Be sure to read this information carefully each time. °Talk to your pediatrician regarding the use of this medicine in children. While this drug may be prescribed for selected conditions, precautions do apply. °Overdosage: If you  think you have taken too much of this medicine contact a poison control center or emergency room at once. °NOTE: This medicine is only for you. Do not share this medicine with others. °What if I miss a dose? °If you miss a dose, take it as soon as you can. If it is almost time for your next dose, take only that dose. Do not take double or extra doses. °What may interact with this medicine? °Do not take this medicine with any of the following medications: °-darbepoetin alfa °This list may not describe all possible interactions. Give your health care provider a list of all the medicines, herbs, non-prescription drugs, or dietary supplements you use. Also tell them if you smoke, drink alcohol, or use illegal drugs. Some items may interact with your medicine. °What should I watch for while using this medicine? °Your condition will be monitored carefully while you are receiving this medicine. °You may need blood work done while you are taking this medicine. °What side effects may I notice from receiving this medicine? °Side effects that you should report to your doctor or health care professional as soon as possible: °-allergic reactions like skin rash, itching or hives, swelling of the face, lips, or tongue °-breathing problems °-changes in vision °-chest pain °-confusion, trouble speaking or understanding °-feeling faint or lightheaded, falls °-high blood pressure °-muscle aches or pains °-pain, swelling, warmth in the leg °-rapid weight gain °-severe headaches °-sudden numbness or weakness of the face, arm or leg °-trouble walking, dizziness, loss of balance or coordination °-seizures (convulsions) °-swelling of the ankles, feet, hands °-unusually weak or tired °  Side effects that usually do not require medical attention (report to your doctor or health care professional if they continue or are bothersome): °-diarrhea °-fever, chills (flu-like symptoms) °-headaches °-nausea, vomiting °-redness, stinging, or swelling at  site where injected °This list may not describe all possible side effects. Call your doctor for medical advice about side effects. You may report side effects to FDA at 1-800-FDA-1088. °Where should I keep my medicine? °Keep out of the reach of children. °Store in a refrigerator between 2 and 8 degrees C (36 and 46 degrees F). Do not freeze or shake. Throw away any unused portion if using a single-dose vial. Multi-dose vials can be kept in the refrigerator for up to 21 days after the initial dose. Throw away unused medicine. °NOTE: This sheet is a summary. It may not cover all possible information. If you have questions about this medicine, talk to your doctor, pharmacist, or health care provider. °© 2017 Elsevier/Gold Standard (2016-07-30 19:42:31) ° °

## 2016-12-21 ENCOUNTER — Other Ambulatory Visit: Payer: Self-pay | Admitting: *Deleted

## 2016-12-21 DIAGNOSIS — D638 Anemia in other chronic diseases classified elsewhere: Secondary | ICD-10-CM

## 2016-12-25 ENCOUNTER — Inpatient Hospital Stay: Payer: Medicare Other | Attending: Internal Medicine

## 2016-12-25 ENCOUNTER — Inpatient Hospital Stay: Payer: Medicare Other

## 2016-12-25 VITALS — BP 120/57 | HR 71 | Temp 95.7°F | Resp 18

## 2016-12-25 DIAGNOSIS — I129 Hypertensive chronic kidney disease with stage 1 through stage 4 chronic kidney disease, or unspecified chronic kidney disease: Secondary | ICD-10-CM | POA: Insufficient documentation

## 2016-12-25 DIAGNOSIS — Z87891 Personal history of nicotine dependence: Secondary | ICD-10-CM | POA: Insufficient documentation

## 2016-12-25 DIAGNOSIS — I2510 Atherosclerotic heart disease of native coronary artery without angina pectoris: Secondary | ICD-10-CM | POA: Insufficient documentation

## 2016-12-25 DIAGNOSIS — F0390 Unspecified dementia without behavioral disturbance: Secondary | ICD-10-CM | POA: Diagnosis not present

## 2016-12-25 DIAGNOSIS — E119 Type 2 diabetes mellitus without complications: Secondary | ICD-10-CM | POA: Diagnosis not present

## 2016-12-25 DIAGNOSIS — D638 Anemia in other chronic diseases classified elsewhere: Secondary | ICD-10-CM

## 2016-12-25 DIAGNOSIS — H409 Unspecified glaucoma: Secondary | ICD-10-CM | POA: Insufficient documentation

## 2016-12-25 DIAGNOSIS — D509 Iron deficiency anemia, unspecified: Secondary | ICD-10-CM

## 2016-12-25 DIAGNOSIS — I4891 Unspecified atrial fibrillation: Secondary | ICD-10-CM | POA: Diagnosis not present

## 2016-12-25 DIAGNOSIS — I252 Old myocardial infarction: Secondary | ICD-10-CM | POA: Diagnosis not present

## 2016-12-25 DIAGNOSIS — M1990 Unspecified osteoarthritis, unspecified site: Secondary | ICD-10-CM | POA: Insufficient documentation

## 2016-12-25 DIAGNOSIS — Z8673 Personal history of transient ischemic attack (TIA), and cerebral infarction without residual deficits: Secondary | ICD-10-CM | POA: Diagnosis not present

## 2016-12-25 DIAGNOSIS — E7800 Pure hypercholesterolemia, unspecified: Secondary | ICD-10-CM | POA: Diagnosis not present

## 2016-12-25 DIAGNOSIS — D631 Anemia in chronic kidney disease: Secondary | ICD-10-CM | POA: Insufficient documentation

## 2016-12-25 DIAGNOSIS — N189 Chronic kidney disease, unspecified: Secondary | ICD-10-CM | POA: Insufficient documentation

## 2016-12-25 DIAGNOSIS — Z79899 Other long term (current) drug therapy: Secondary | ICD-10-CM | POA: Diagnosis not present

## 2016-12-25 DIAGNOSIS — F419 Anxiety disorder, unspecified: Secondary | ICD-10-CM | POA: Insufficient documentation

## 2016-12-25 LAB — HEMATOCRIT: HCT: 28.8 % — ABNORMAL LOW (ref 35.0–47.0)

## 2016-12-25 LAB — HEMOGLOBIN: Hemoglobin: 9.1 g/dL — ABNORMAL LOW (ref 12.0–16.0)

## 2016-12-25 MED ORDER — EPOETIN ALFA 40000 UNIT/ML IJ SOLN
40000.0000 [IU] | INTRAMUSCULAR | Status: DC
  Administered 2016-12-25: 40000 [IU] via SUBCUTANEOUS

## 2017-01-01 ENCOUNTER — Inpatient Hospital Stay (HOSPITAL_BASED_OUTPATIENT_CLINIC_OR_DEPARTMENT_OTHER): Payer: Medicare Other | Admitting: Internal Medicine

## 2017-01-01 ENCOUNTER — Inpatient Hospital Stay: Payer: Medicare Other

## 2017-01-01 ENCOUNTER — Other Ambulatory Visit: Payer: Self-pay | Admitting: *Deleted

## 2017-01-01 VITALS — BP 106/66 | HR 57 | Temp 96.8°F | Resp 20

## 2017-01-01 DIAGNOSIS — D509 Iron deficiency anemia, unspecified: Secondary | ICD-10-CM

## 2017-01-01 DIAGNOSIS — D500 Iron deficiency anemia secondary to blood loss (chronic): Secondary | ICD-10-CM

## 2017-01-01 DIAGNOSIS — Z79899 Other long term (current) drug therapy: Secondary | ICD-10-CM

## 2017-01-01 DIAGNOSIS — D638 Anemia in other chronic diseases classified elsewhere: Secondary | ICD-10-CM

## 2017-01-01 DIAGNOSIS — I129 Hypertensive chronic kidney disease with stage 1 through stage 4 chronic kidney disease, or unspecified chronic kidney disease: Secondary | ICD-10-CM | POA: Diagnosis not present

## 2017-01-01 LAB — SAMPLE TO BLOOD BANK

## 2017-01-01 LAB — HEMATOCRIT: HEMATOCRIT: 24.4 % — AB (ref 35.0–47.0)

## 2017-01-01 LAB — HEMOGLOBIN: Hemoglobin: 7.4 g/dL — ABNORMAL LOW (ref 12.0–16.0)

## 2017-01-01 LAB — PREPARE RBC (CROSSMATCH)

## 2017-01-01 MED ORDER — SODIUM CHLORIDE 0.9 % IV SOLN: Freq: Once | INTRAVENOUS | Status: DC

## 2017-01-01 MED ORDER — FERUMOXYTOL INJECTION 510 MG/17 ML: 510.0000 mg | Freq: Once | INTRAVENOUS | Status: DC

## 2017-01-01 MED ORDER — EPOETIN ALFA 40000 UNIT/ML IJ SOLN
40000.0000 [IU] | INTRAMUSCULAR | Status: DC
  Administered 2017-01-01: 40000 [IU] via SUBCUTANEOUS
  Filled 2017-01-01: qty 1

## 2017-01-01 NOTE — Progress Notes (Signed)
Patient here today for follow up.   

## 2017-01-01 NOTE — Progress Notes (Signed)
1400 - Called and spoke to [place] in blood bank. Notified blood bank pt will receive 1 unit of blood in [place] on Wednesday 01/02/17. She states that she has not yet received the specimen from [place]. Most likely specimen is still in route, but she will f/u with this.

## 2017-01-01 NOTE — Progress Notes (Signed)
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   A very pleasant 83 year old Caucasian female patient with long-standing history of anemia and also moderate dementia is here for follow-up of her anemia. Patient received IV iron 4 weeks ago.  As per the son patient noted to have worsening shortness of breath.  As per the son no blood in stools or black stools; no nausea no vomiting abdominal pain. Weight is steady.  REVIEW OF SYSTEMS:  Difficult to assess given her dementia.  PAST MEDICAL HISTORY :  Past Medical History:  Diagnosis Date  . Anxiety   . Aortic sclerosis ([place])   . AV malformation of gastrointestinal tract    AV malformations of the duodenum  . Coronary artery disease   . Dementia   . DM (diabetes mellitus) ([place])   . Elevated carcinoembryonic antigen (CEA)   . Glaucoma   . H/O bone scan 04/27/2011  . History of atrial fibrillation   . History of esophageal stricture   . History of MI (myocardial infarction)   . HTN (hypertension)   . Hypercholesteremia   . IDA (iron deficiency anemia)   . Osteoarthritis   . Osteoarthritis   . Osteoporosis   . Pulmonary nodules   . Stroke [facility])     PAST SURGICAL HISTORY :   Past Surgical History:  Procedure Laterality Date  . APPENDECTOMY    . COLONOSCOPY    . Endovascular Angioplasty Femoral/Popliteal Artery W/Stent/Atherectomy  1990  . POLYPECTOMY    . THYROIDECTOMY, PARTIAL    . TONSILLECTOMY    . UPPER GASTROINTESTINAL ENDOSCOPY  2015    FAMILY HISTORY :   Family History  Problem Relation Age of Onset  . Coronary artery disease Mother   . Diabetes  Mellitus II Mother   . Heart attack Father   . Heart attack Sister   . Aortic aneurysm Sister   . Prostate cancer Brother     brother #1  . Heart attack Brother     brother #1  . Prostate cancer Brother     brother #2  . Heart attack Brother     brother #2  . Heart attack Brother     brother #3    SOCIAL HISTORY:   Social History  Substance Use Topics  . Smoking status: Former Smoker    Packs/day: 0.50    Years: 40.00    Types: Cigarettes  . Smokeless tobacco: Never Used     Comment: quit in 1991  . Alcohol use No     Comment: previous ETOH abuse-stopped 3 years ago    ALLERGIES:  is allergic to penicillin g; sulfa antibiotics; ace inhibitors; and penicillin v potassium.  MEDICATIONS:  Current Outpatient Prescriptions  Medication Sig Dispense Refill  . acetaminophen (TYLENOL) 325 MG tablet Take 650 mg by mouth.    Marland Kitchen amLODipine (NORVASC) 5 MG tablet Take 5 mg by mouth daily.  3  . atorvastatin (LIPITOR) 20 MG tablet Take 20 mg by mouth.    . Calcium Carb-Cholecalciferol 600-800 MG-UNIT CHEW Chew 1 tablet by mouth at bedtime.    . donepezil (ARICEPT)  10 MG tablet Take 10 mg by mouth at bedtime.  6  . isosorbide mononitrate (IMDUR) 30 MG 24 hr tablet Take by mouth.    . metoprolol succinate (TOPROL-XL) 100 MG 24 hr tablet Take 100 mg by mouth daily.  3  . Multiple Vitamins-Minerals (CENTRUM SILVER PO) Take 1 tablet by mouth 1 day or 1 dose.    . Multiple Vitamins-Minerals (PRESERVISION AREDS) TABS Take 1 tablet by mouth 2 (two) times daily.     Marland Kitchen omeprazole (PRILOSEC) 10 MG capsule Take 20 mg by mouth.    . nitroGLYCERIN (NITROSTAT) 0.4 MG SL tablet Place under the tongue.     No current facility-administered medications for this visit.    Facility-Administered Medications Ordered in Other Visits  Medication Dose Route Frequency Provider Last Rate Last Dose  . 0.9 %  sodium chloride infusion  250 mL Intravenous Once Evlyn Kanner, NP      . acetaminophen (TYLENOL)  tablet 650 mg  650 mg Oral Once Evlyn Kanner, NP      . diphenhydrAMINE (BENADRYL) capsule 25 mg  25 mg Oral Once Evlyn Kanner, NP      . epoetin alfa (EPOGEN,PROCRIT) injection 40,000 Units  40,000 Units Subcutaneous Weekly Cammie Sickle, MD   40,000 Units at 01/01/17 1029    PHYSICAL EXAMINATION: ECOG PERFORMANCE STATUS: 1 - Symptomatic but completely ambulatory  BP 105/60 (BP Location: Right Arm, Patient Position: Sitting)   Pulse 72   Temp 97.3 F (36.3 C) (Tympanic)   Wt 124 lb 12.5 oz (56.6 kg)   SpO2 95%   BMI 23.58 kg/m   Filed Weights   01/01/17 0914  Weight: 124 lb 12.5 oz (56.6 kg)    GENERAL: Well-nourished well-developed; Alert, no distress and comfortable.   Accompanied by her SON.  EYES: positive for pallor OROPHARYNX: no thrush or ulceration. NECK: supple, no masses felt LYMPH:  no palpable lymphadenopathy in the cervical, axillary or inguinal regions LUNGS: Clear to auscultation bilaterally. No significant crackles noted HEART/CVS: regular rate & rhythm and positive for systolic murmur; No lower extremity edema ABDOMEN:abdomen soft, non-tender and normal bowel sounds Musculoskeletal:no cyanosis of digits and no clubbing  PSYCH: alert & oriented x 1-2.  NEURO: no focal motor/sensory deficits SKIN:  no rashes or significant lesions  LABORATORY DATA:  I have reviewed the data as listed    Component Value Date/Time   NA 140 12/11/2016 0855   NA 140 04/19/2015 0852   K 3.8 12/11/2016 0855   K 3.6 04/19/2015 0852   CL 105 12/11/2016 0855   CL 102 04/19/2015 0852   CO2 28 12/11/2016 0855   CO2 29 04/19/2015 0852   GLUCOSE 153 (H) 12/11/2016 0855   GLUCOSE 118 (H) 04/19/2015 0852   BUN 22 (H) 12/11/2016 0855   BUN 17 04/19/2015 0852   CREATININE 0.90 12/11/2016 0855   CREATININE 0.83 04/19/2015 0852   CALCIUM 9.5 12/11/2016 0855   CALCIUM 9.3 04/19/2015 0852   PROT 6.3 (L) 12/11/2016 0855   PROT 6.3 (L) 04/19/2015 0852   ALBUMIN 3.5  12/11/2016 0855   ALBUMIN 3.5 04/19/2015 0852   AST 23 12/11/2016 0855   AST 30 04/19/2015 0852   ALT 17 12/11/2016 0855   ALT 26 04/19/2015 0852   ALKPHOS 81 12/11/2016 0855   ALKPHOS 71 04/19/2015 0852   BILITOT 0.5 12/11/2016 0855   BILITOT 0.4 04/19/2015 0852   GFRNONAA 54 (L) 12/11/2016 0855   GFRNONAA >60 04/19/2015 [id number]  GFRAA >60 12/11/2016 0855   GFRAA >60 04/19/2015 0852    No results found for: SPEP, UPEP  Lab Results  Component Value Date   WBC 4.5 12/11/2016   NEUTROABS 3.5 12/11/2016   HGB 7.4 (L) 01/01/2017   HCT 24.4 (L) 01/01/2017   MCV 98.9 12/11/2016   PLT 233 12/11/2016      Chemistry      Component Value Date/Time   NA 140 12/11/2016 0855   NA 140 04/19/2015 0852   K 3.8 12/11/2016 0855   K 3.6 04/19/2015 0852   CL 105 12/11/2016 0855   CL 102 04/19/2015 0852   CO2 28 12/11/2016 0855   CO2 29 04/19/2015 0852   BUN 22 (H) 12/11/2016 0855   BUN 17 04/19/2015 0852   CREATININE 0.90 12/11/2016 0855   CREATININE 0.83 04/19/2015 0852      Component Value Date/Time   CALCIUM 9.5 12/11/2016 0855   CALCIUM 9.3 04/19/2015 0852   ALKPHOS 81 12/11/2016 0855   ALKPHOS 71 04/19/2015 0852   AST 23 12/11/2016 0855   AST 30 04/19/2015 0852   ALT 17 12/11/2016 0855   ALT 26 04/19/2015 0852   BILITOT 0.5 12/11/2016 0855   BILITOT 0.4 04/19/2015 0852        ASSESSMENT & PLAN:   Iron deficiency anemia due to chronic blood loss # Anemia- MDS/iron def Anemia- chronic GI blood loss versus other causes currently on Procrit 40,000 units on a weekly basis.   # Today hemoglobin is 7.4. Recommend 1 unit of transfusion tomorrow.  IV iron/ procrit.  # Weekly H&H/Procrit;  with a CBC CMP and iron studies- 58M; follow-up with me in 3 months.      Cammie Sickle, MD 01/01/2017 11:54 AM

## 2017-01-01 NOTE — Assessment & Plan Note (Addendum)
#   Anemia- MDS/iron def Anemia- chronic GI blood loss versus other causes currently on Procrit 40,000 units on a weekly basis.   # Today hemoglobin is 7.4. Recommend 1 unit of transfusion tomorrow.  IV iron/ procrit.  # Weekly H&H/Procrit;  with a CBC CMP and iron studies- 57M; follow-up with me in 3 months.

## 2017-01-02 ENCOUNTER — Inpatient Hospital Stay: Payer: Medicare Other

## 2017-01-02 DIAGNOSIS — D500 Iron deficiency anemia secondary to blood loss (chronic): Secondary | ICD-10-CM

## 2017-01-02 DIAGNOSIS — I129 Hypertensive chronic kidney disease with stage 1 through stage 4 chronic kidney disease, or unspecified chronic kidney disease: Secondary | ICD-10-CM | POA: Diagnosis not present

## 2017-01-02 MED ORDER — FUROSEMIDE 10 MG/ML IJ SOLN
20.0000 mg | Freq: Once | INTRAMUSCULAR | Status: AC
  Administered 2017-01-02: 20 mg via INTRAVENOUS
  Filled 2017-01-02: qty 2

## 2017-01-02 MED ORDER — ACETAMINOPHEN 325 MG PO TABS
650.0000 mg | ORAL_TABLET | Freq: Once | ORAL | Status: AC
  Administered 2017-01-02: 650 mg via ORAL
  Filled 2017-01-02: qty 2

## 2017-01-02 MED ORDER — SODIUM CHLORIDE 0.9 % IV SOLN
250.0000 mL | Freq: Once | INTRAVENOUS | Status: AC
  Administered 2017-01-02: 250 mL via INTRAVENOUS
  Filled 2017-01-02: qty 250

## 2017-01-03 LAB — TYPE AND SCREEN
ABO/RH(D): A NEG
ANTIBODY SCREEN: NEGATIVE
Unit division: 0

## 2017-01-08 ENCOUNTER — Other Ambulatory Visit: Payer: Medicare Other

## 2017-01-08 ENCOUNTER — Ambulatory Visit: Payer: Medicare Other

## 2017-01-09 ENCOUNTER — Ambulatory Visit: Payer: Medicare Other

## 2017-01-15 ENCOUNTER — Inpatient Hospital Stay: Payer: Medicare Other

## 2017-01-22 ENCOUNTER — Inpatient Hospital Stay: Payer: Medicare Other

## 2017-01-22 DIAGNOSIS — D509 Iron deficiency anemia, unspecified: Secondary | ICD-10-CM

## 2017-01-22 DIAGNOSIS — D638 Anemia in other chronic diseases classified elsewhere: Secondary | ICD-10-CM

## 2017-01-22 LAB — HEMOGLOBIN: Hemoglobin: 13.2 g/dL (ref 12.0–16.0)

## 2017-01-22 LAB — HEMATOCRIT: HCT: 42.8 % (ref 35.0–47.0)

## 2017-01-22 MED ORDER — SODIUM CHLORIDE 0.9 % IV SOLN: 510.0000 mg | Freq: Once | INTRAVENOUS | Status: DC

## 2017-01-22 MED ORDER — SODIUM CHLORIDE 0.9 % IV SOLN: Freq: Once | INTRAVENOUS | Status: DC

## 2017-01-29 ENCOUNTER — Inpatient Hospital Stay: Payer: No Typology Code available for payment source

## 2017-01-29 ENCOUNTER — Inpatient Hospital Stay: Payer: No Typology Code available for payment source | Attending: Internal Medicine

## 2017-01-29 DIAGNOSIS — Z79899 Other long term (current) drug therapy: Secondary | ICD-10-CM | POA: Insufficient documentation

## 2017-01-29 DIAGNOSIS — D509 Iron deficiency anemia, unspecified: Secondary | ICD-10-CM | POA: Diagnosis present

## 2017-01-29 DIAGNOSIS — D638 Anemia in other chronic diseases classified elsewhere: Secondary | ICD-10-CM

## 2017-01-29 LAB — HEMATOCRIT: HEMATOCRIT: 42.4 % (ref 35.0–47.0)

## 2017-01-29 LAB — HEMOGLOBIN: Hemoglobin: 13.2 g/dL (ref 12.0–16.0)

## 2017-02-05 ENCOUNTER — Inpatient Hospital Stay: Payer: No Typology Code available for payment source

## 2017-02-05 DIAGNOSIS — D509 Iron deficiency anemia, unspecified: Secondary | ICD-10-CM | POA: Diagnosis not present

## 2017-02-05 DIAGNOSIS — D638 Anemia in other chronic diseases classified elsewhere: Secondary | ICD-10-CM

## 2017-02-05 DIAGNOSIS — D500 Iron deficiency anemia secondary to blood loss (chronic): Secondary | ICD-10-CM

## 2017-02-05 LAB — SAMPLE TO BLOOD BANK

## 2017-02-05 LAB — HEMATOCRIT: HEMATOCRIT: 35 % (ref 35.0–47.0)

## 2017-02-05 LAB — HEMOGLOBIN: Hemoglobin: 11.2 g/dL — ABNORMAL LOW (ref 12.0–16.0)

## 2017-02-06 DIAGNOSIS — Z79899 Other long term (current) drug therapy: Secondary | ICD-10-CM | POA: Diagnosis not present

## 2017-02-06 DIAGNOSIS — D509 Iron deficiency anemia, unspecified: Secondary | ICD-10-CM | POA: Diagnosis present

## 2017-02-12 ENCOUNTER — Inpatient Hospital Stay: Payer: No Typology Code available for payment source

## 2017-02-12 ENCOUNTER — Encounter: Payer: Self-pay | Admitting: *Deleted

## 2017-02-12 DIAGNOSIS — D638 Anemia in other chronic diseases classified elsewhere: Secondary | ICD-10-CM

## 2017-02-12 NOTE — Progress Notes (Signed)
Patient came to clinic today for lab/injection apt today. Phlebotomist attempted lab only apts today. Stuck patient twice in lab. Patient irate and confused in her behavior today. Pulling phlebotomy needle out of arm -screaming when phlebotomist placed tourniquet on arm. Multiple attempts to reorient patient and reassurance provided. RN attempted phlebotomy draw twice. Patient continued to pull tourniquet off-flapping her arms in the air and kicking her legs. Patient pushed RN's hand (which had an open needle) away from pt's body in attempt to stop the lab draw.  pt became aggressive and Pt shouted and screamed at lab and nurse personnel and said-let me go home I dont want to be here. I don't want to be stuck.  RN spoke with caregiver. Explained that patient would not allow lab draw. In this state, we are unable to perform lab draw procedure due to the safety concerns for the patient. MD made aware that patient was refusing lab draw. Decision made to hold off on lab draw today. Offered caregiver another apt this week for re attempt lab draw or pt could wait until next week. Encouraged caregiver to have pt increase po fluid intake. Caregiver (son in law) states that patient has had irrational behavior since pt's stroke. Pt currently resides in assisted living but will be going home soon. He states, "I worry that my family will not be able to handle her at home." Active listening performed and discussed the patient's family concerns about placement.  Per son in law, will hold off on any more lab draws this week and will bring her next week for the apt. He gave verbal understanding of the plan.

## 2017-02-19 ENCOUNTER — Inpatient Hospital Stay: Payer: No Typology Code available for payment source

## 2017-02-19 VITALS — BP 95/51 | HR 69 | Temp 96.8°F | Resp 16

## 2017-02-19 DIAGNOSIS — D638 Anemia in other chronic diseases classified elsewhere: Secondary | ICD-10-CM

## 2017-02-19 DIAGNOSIS — D509 Iron deficiency anemia, unspecified: Secondary | ICD-10-CM

## 2017-02-19 LAB — HEMATOCRIT: HEMATOCRIT: 28.3 % — AB (ref 35.0–47.0)

## 2017-02-19 LAB — HEMOGLOBIN: HEMOGLOBIN: 8.6 g/dL — AB (ref 12.0–16.0)

## 2017-02-19 MED ORDER — EPOETIN ALFA 40000 UNIT/ML IJ SOLN
40000.0000 [IU] | INTRAMUSCULAR | Status: DC
  Administered 2017-02-19: 40000 [IU] via SUBCUTANEOUS

## 2017-02-19 NOTE — Progress Notes (Signed)
Patient very resistant to having her venipuncture. She hollers and moves her arms around. Poor venous access. Patient was stuck twice and was only able to get 1/4 of blood in tube. Daughter asked if we could draw a protime for Dr Gilford Rile as patient is taking coumadin. We were unable to obtain that blood. Dr Rogue Bussing notified of hgb of 8.6 today. Patient was given procrit injection. Family informed of patients behavior and poor venous access.Decision made not to attempt IV iron. Patient is frightened, confused and uncooperative in outpatient setting.Comfort/palliative care option discussed with daughter. Appointment made for patient to see Dr Rogue Bussing next week.

## 2017-02-26 ENCOUNTER — Other Ambulatory Visit: Payer: Self-pay | Admitting: *Deleted

## 2017-02-26 ENCOUNTER — Other Ambulatory Visit: Payer: Self-pay | Admitting: Internal Medicine

## 2017-02-26 ENCOUNTER — Inpatient Hospital Stay: Payer: Medicare Other

## 2017-02-26 ENCOUNTER — Encounter: Payer: Self-pay | Admitting: *Deleted

## 2017-02-26 ENCOUNTER — Inpatient Hospital Stay: Payer: Medicare Other | Attending: Internal Medicine | Admitting: Internal Medicine

## 2017-02-26 VITALS — BP 80/44 | HR 102 | Temp 99.3°F

## 2017-02-26 DIAGNOSIS — D500 Iron deficiency anemia secondary to blood loss (chronic): Secondary | ICD-10-CM

## 2017-02-26 DIAGNOSIS — F0390 Unspecified dementia without behavioral disturbance: Secondary | ICD-10-CM | POA: Diagnosis not present

## 2017-02-26 DIAGNOSIS — Z79899 Other long term (current) drug therapy: Secondary | ICD-10-CM | POA: Diagnosis not present

## 2017-02-26 DIAGNOSIS — E119 Type 2 diabetes mellitus without complications: Secondary | ICD-10-CM | POA: Insufficient documentation

## 2017-02-26 DIAGNOSIS — Z8673 Personal history of transient ischemic attack (TIA), and cerebral infarction without residual deficits: Secondary | ICD-10-CM | POA: Diagnosis not present

## 2017-02-26 DIAGNOSIS — H409 Unspecified glaucoma: Secondary | ICD-10-CM | POA: Insufficient documentation

## 2017-02-26 DIAGNOSIS — R4701 Aphasia: Secondary | ICD-10-CM | POA: Diagnosis not present

## 2017-02-26 DIAGNOSIS — Z7901 Long term (current) use of anticoagulants: Secondary | ICD-10-CM | POA: Insufficient documentation

## 2017-02-26 DIAGNOSIS — I4891 Unspecified atrial fibrillation: Secondary | ICD-10-CM | POA: Diagnosis not present

## 2017-02-26 DIAGNOSIS — I10 Essential (primary) hypertension: Secondary | ICD-10-CM | POA: Diagnosis not present

## 2017-02-26 DIAGNOSIS — F419 Anxiety disorder, unspecified: Secondary | ICD-10-CM | POA: Diagnosis not present

## 2017-02-26 DIAGNOSIS — I959 Hypotension, unspecified: Secondary | ICD-10-CM | POA: Diagnosis not present

## 2017-02-26 DIAGNOSIS — Z87891 Personal history of nicotine dependence: Secondary | ICD-10-CM | POA: Insufficient documentation

## 2017-02-26 DIAGNOSIS — E7800 Pure hypercholesterolemia, unspecified: Secondary | ICD-10-CM | POA: Insufficient documentation

## 2017-02-26 DIAGNOSIS — M1990 Unspecified osteoarthritis, unspecified site: Secondary | ICD-10-CM | POA: Diagnosis not present

## 2017-02-26 DIAGNOSIS — I252 Old myocardial infarction: Secondary | ICD-10-CM | POA: Insufficient documentation

## 2017-02-26 DIAGNOSIS — I2510 Atherosclerotic heart disease of native coronary artery without angina pectoris: Secondary | ICD-10-CM | POA: Insufficient documentation

## 2017-02-26 LAB — HEMOGLOBIN: Hemoglobin: 8.1 g/dL — ABNORMAL LOW (ref 12.0–16.0)

## 2017-02-26 LAB — SAMPLE TO BLOOD BANK

## 2017-02-26 LAB — HEMATOCRIT: HCT: 26.5 % — ABNORMAL LOW (ref 35.0–47.0)

## 2017-02-26 MED ORDER — SODIUM CHLORIDE 0.9% FLUSH
10.0000 mL | INTRAVENOUS | Status: DC | PRN
  Filled 2017-02-26: qty 10

## 2017-02-26 MED ORDER — HEPARIN SOD (PORK) LOCK FLUSH 100 UNIT/ML IV SOLN: 250.0000 [IU] | INTRAVENOUS | Status: DC | PRN

## 2017-02-26 MED ORDER — SODIUM CHLORIDE 0.9 % IV SOLN: 250.0000 mL | Freq: Once | INTRAVENOUS | Status: DC

## 2017-02-26 MED ORDER — ACETAMINOPHEN 325 MG PO TABS: 650.0000 mg | ORAL_TABLET | Freq: Once | ORAL | Status: DC

## 2017-02-26 MED ORDER — HEPARIN SOD (PORK) LOCK FLUSH 100 UNIT/ML IV SOLN: 500.0000 [IU] | Freq: Every day | INTRAVENOUS | Status: DC | PRN

## 2017-02-26 MED ORDER — SODIUM CHLORIDE 0.9% FLUSH
3.0000 mL | INTRAVENOUS | Status: DC | PRN
  Filled 2017-02-26: qty 3

## 2017-02-26 MED ORDER — FUROSEMIDE 10 MG/ML IJ SOLN: 20.0000 mg | Freq: Once | INTRAMUSCULAR | Status: DC

## 2017-02-26 NOTE — Progress Notes (Signed)
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   In the interim patient was diagnosed with stroke- left MCA status post thrombectomy. The stroke was thought likely related to A. Fib; currently on Coumadin as per neurology at [facility].  Patient's most recent hemoglobin 7.9; iron saturations- 4%. INR 1.8. [on march 5th]. As per the family patient is more short of breath. She has been wheezing. No swelling in the legs. No pain. Denies any frank blood in stools or black stools.  REVIEW OF SYSTEMS:  Difficult to assess given her dementia.  PAST MEDICAL HISTORY :  Past Medical History:  Diagnosis Date  . Anxiety   . Aortic sclerosis ([place])   . AV malformation of gastrointestinal tract    AV malformations of the duodenum  . Coronary artery disease   . Dementia   . DM (diabetes mellitus) ([place])   . Elevated carcinoembryonic antigen (CEA)   . Glaucoma   . H/O bone scan 04/27/2011  . History of atrial fibrillation   . History of esophageal stricture   . History of MI (myocardial infarction)   . HTN (hypertension)   . Hypercholesteremia   . IDA (iron deficiency anemia)   . Osteoarthritis   . Osteoarthritis   . Osteoporosis   . Pulmonary nodules   . Stroke [facility])     PAST SURGICAL HISTORY :   Past Surgical History:  Procedure Laterality Date  . APPENDECTOMY    . COLONOSCOPY    . Endovascular Angioplasty Femoral/Popliteal Artery W/Stent/Atherectomy  1990  . POLYPECTOMY     . THYROIDECTOMY, PARTIAL    . TONSILLECTOMY    . UPPER GASTROINTESTINAL ENDOSCOPY  2015    FAMILY HISTORY :   Family History  Problem Relation Age of Onset  . Coronary artery disease Mother   . Diabetes Mellitus II Mother   . Heart attack Father   . Heart attack Sister   . Aortic aneurysm Sister   . Prostate cancer Brother     brother #1  . Heart attack Brother     brother #1  . Prostate cancer Brother     brother #2  . Heart attack Brother     brother #2  . Heart attack Brother     brother #3    SOCIAL HISTORY:   Social History  Substance Use Topics  . Smoking status: Former Smoker    Packs/day: 0.50    Years: 40.00    Types: Cigarettes  . Smokeless tobacco: Never Used     Comment: quit in 1991  . Alcohol use No     Comment: previous ETOH abuse-stopped 3 years ago    ALLERGIES:  is allergic to penicillin g; sulfa antibiotics; ace inhibitors; and penicillin v potassium.  MEDICATIONS:  Current Outpatient Prescriptions  Medication Sig Dispense Refill  . acetaminophen (TYLENOL) 325 MG tablet Take 650 mg by mouth.    Marland Kitchen albuterol (PROVENTIL) (2.5 MG/3ML) 0.083% nebulizer solution Inhale into the lungs.    Marland Kitchen  albuterol (PROVENTIL) (5 MG/ML) 0.5% nebulizer solution Inhale into the lungs.    Marland Kitchen atorvastatin (LIPITOR) 20 MG tablet Take 20 mg by mouth.    . Calcium Carb-Cholecalciferol 600-800 MG-UNIT CHEW Chew 1 tablet by mouth at bedtime.    . donepezil (ARICEPT) 10 MG tablet Take 10 mg by mouth at bedtime.  6  . isosorbide dinitrate (ISORDIL) 5 MG tablet Take 3 tablets by mouth 2 (two) times daily.    Marland Kitchen METOPROLOL TARTRATE PO Take 6.25 capsules by mouth every 12 (twelve) hours.    Marland Kitchen omeprazole (PRILOSEC) 10 MG capsule Take 20 mg by mouth.    . warfarin (COUMADIN) 1 MG tablet Take 2 mg by mouth 2 (two) times daily.    . nitroGLYCERIN (NITROSTAT) 0.4 MG SL tablet Place under the tongue.     No current facility-administered medications for this visit.     Facility-Administered Medications Ordered in Other Visits  Medication Dose Route Frequency Provider Last Rate Last Dose  . 0.9 %  sodium chloride infusion  250 mL Intravenous Once Evlyn Kanner, NP      . acetaminophen (TYLENOL) tablet 650 mg  650 mg Oral Once Evlyn Kanner, NP      . diphenhydrAMINE (BENADRYL) capsule 25 mg  25 mg Oral Once Evlyn Kanner, NP        PHYSICAL EXAMINATION: ECOG PERFORMANCE STATUS: 1 - Symptomatic but completely ambulatory  BP (!) 80/44 (BP Location: Left Arm, Patient Position: Sitting)   Pulse (!) 102   Temp 99.3 F (37.4 C) (Tympanic)   Filed Weights    GENERAL: Well-nourished well-developed; Alert, no distress and comfortable.   Accompanied by her daughters.  EYES: positive for pallor OROPHARYNX: no thrush or ulceration. NECK: supple, no masses felt LYMPH:  no palpable lymphadenopathy in the cervical, axillary or inguinal regions LUNGS: Clear to auscultation bilaterally. No significant crackles noted. Positive for wheezing.  HEART/CVS: iregular rhythm and positive for systolic murmur; No lower extremity edema ABDOMEN:abdomen soft, non-tender and normal bowel sounds Musculoskeletal:no cyanosis of digits and no clubbing  PSYCH: alert & oriented x 0-1.   NEURO: no focal motor/sensory deficits SKIN:  no rashes or significant lesions  LABORATORY DATA:  I have reviewed the data as listed    Component Value Date/Time   NA 140 12/11/2016 0855   NA 140 04/19/2015 0852   K 3.8 12/11/2016 0855   K 3.6 04/19/2015 0852   CL 105 12/11/2016 0855   CL 102 04/19/2015 0852   CO2 28 12/11/2016 0855   CO2 29 04/19/2015 0852   GLUCOSE 153 (H) 12/11/2016 0855   GLUCOSE 118 (H) 04/19/2015 0852   BUN 22 (H) 12/11/2016 0855   BUN 17 04/19/2015 0852   CREATININE 0.90 12/11/2016 0855   CREATININE 0.83 04/19/2015 0852   CALCIUM 9.5 12/11/2016 0855   CALCIUM 9.3 04/19/2015 0852   PROT 6.3 (L) 12/11/2016 0855   PROT 6.3 (L) 04/19/2015 0852    ALBUMIN 3.5 12/11/2016 0855   ALBUMIN 3.5 04/19/2015 0852   AST 23 12/11/2016 0855   AST 30 04/19/2015 0852   ALT 17 12/11/2016 0855   ALT 26 04/19/2015 0852   ALKPHOS 81 12/11/2016 0855   ALKPHOS 71 04/19/2015 0852   BILITOT 0.5 12/11/2016 0855   BILITOT 0.4 04/19/2015 0852   GFRNONAA 54 (L) 12/11/2016 0855   GFRNONAA >60 04/19/2015 0852   GFRAA >60 12/11/2016 0855   GFRAA >60 04/19/2015 0852    No results found for: SPEP,  UPEP  Lab Results  Component Value Date   WBC 4.5 12/11/2016   NEUTROABS 3.5 12/11/2016   HGB 8.1 (L) 02/26/2017   HCT 26.5 (L) 02/26/2017   MCV 98.9 12/11/2016   PLT 233 12/11/2016      Chemistry      Component Value Date/Time   NA 140 12/11/2016 0855   NA 140 04/19/2015 0852   K 3.8 12/11/2016 0855   K 3.6 04/19/2015 0852   CL 105 12/11/2016 0855   CL 102 04/19/2015 0852   CO2 28 12/11/2016 0855   CO2 29 04/19/2015 0852   BUN 22 (H) 12/11/2016 0855   BUN 17 04/19/2015 0852   CREATININE 0.90 12/11/2016 0855   CREATININE 0.83 04/19/2015 0852      Component Value Date/Time   CALCIUM 9.5 12/11/2016 0855   CALCIUM 9.3 04/19/2015 0852   ALKPHOS 81 12/11/2016 0855   ALKPHOS 71 04/19/2015 0852   AST 23 12/11/2016 0855   AST 30 04/19/2015 0852   ALT 17 12/11/2016 0855   ALT 26 04/19/2015 0852   BILITOT 0.5 12/11/2016 0855   BILITOT 0.4 04/19/2015 0852        ASSESSMENT & PLAN:   Iron deficiency anemia due to chronic blood loss # Anemia- MDS/iron def Anemia- chronic GI blood loss versus other causes. [Currently on Coumadin-see discussion below]; hemoglobin March 5th [PCP]- 7.9; iron studies suggestive of iron deficiency. Patient's symptomatic. Recommend 2 units of PRBC transfusion.  # The etiology of iron deficiency is unclear- however patient responds very well to IV Feraheme. We'll plan to IV Feraheme again next week.  # Recent stroke/A. fib- improved motor function; however, aphasia not improved. She is currently at home. On Coumadin  as per neurology/PCP  # Hypotension- systolic [id number]- hold blood pressure medication. Recommend checking blood pressure at home; deferred to PCP regarding restarting blood pressure medication.  # Dementia/delirious/poor IV access; need for high acuity of nursing care needed nterfering with administration of the blood products/or draws/or IV iron at [facility]- given the. Family upset. Discussed with nursing administration. Will plan PRBC transfusion tomorrow at same day surgery.   # Family not interested in any palliative care discussion.   # # Check PT/INR-  to be forwarded to PCP/ Dr.Walker./weekly H&H; Procrit/IV iron  # 40 minutes face-to-face with the patient discussing the above plan of care; more than 50% of time spent on prognosis/ natural history; counseling and coordination.     Cammie Sickle, MD 02/26/2017 1:34 PM

## 2017-02-26 NOTE — Assessment & Plan Note (Addendum)
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 [Currently on Coumadin-see discussion below]; hemoglobin March 5th [PCP]- 7.9; iron studies suggestive of iron deficiency. Patient's symptomatic. Recommend 2 units of PRBC transfusion.  # The etiology of iron deficiency is unclear- however patient responds very well to IV Feraheme. We'll plan to IV Feraheme again next week.  # Recent stroke/A. fib- improved motor function; however, aphasia not improved. She is currently at home. On Coumadin as per neurology/PCP  # Hypotension- systolic [id number]- hold blood pressure medication. Recommend checking blood pressure at home; deferred to PCP regarding restarting blood pressure medication.  # Dementia/delirious/poor IV access; need for high acuity of nursing care needed nterfering with administration of the blood products/or draws/or IV iron at [facility]- given the. Family upset. Discussed with nursing administration. Will plan PRBC transfusion tomorrow at same day surgery.   # Family not interested in any palliative care discussion.   # # Check PT/INR-  to be forwarded to PCP/ Dr.Walker./weekly H&H; Procrit/IV iron  # 40 minutes face-to-face with the patient discussing the above plan of care; more than 50% of time spent on prognosis/ natural history; counseling and coordination.

## 2017-02-26 NOTE — Progress Notes (Signed)
Spoke with blood bank. Notified blood bank that orders were released for 2 units of blood and type and screen. Blood bank confirmed that they had the hold blood bank tube specimen. Notified blood bank that the 2 units of blood would be infused in SDS dept at [facility].

## 2017-02-26 NOTE — Progress Notes (Signed)
Patient here today for follow up.   

## 2017-02-26 NOTE — Progress Notes (Signed)
Daughters Juliann Pulse and [place]) present today in cancer center visit with md.  hgb drawn at [facility] 7.9- symptomatic anemia. Patient currently is taking coumadin for afib.  Pt's family requesting cancer center to weekly pt/inr levels for pcp. Family brought lab requisition from pcp office. - for weekly pt/inr. Dr. Zenovia Jordan weekly lab draw for pt/inr due to pt's poor venous access.  pcp will receive a copy of these weekly pt/inr labs and will manage the pt/inr levels and coumadin dosing  Family requesting the MD to order a blood transfusion for patient.  MD and RN explained that patient now requires a higher level of nursing acuity in the outpatient infusion suite.  Explained to pt's family that the patient has had a change in status since LAST IV infusion in our outpatient cancer center. This is r/t to her recent CVA.  Pt remains at high risk for falls (ambulation with assist), has poor venous access, dementia-induced agitation and memory impairment r/t CVA, history of CHF and afib and CVA.  Pt requires frequent re-orientation and potentially at risk for pulling out iv line.  Daughters made aware that given the multiple patient safety concerns, md will arrange to infuse pt's blood in SDS dept tomorrow.  Daughter's made aware that the pt's future intravenous blood transfusions and intravenous iron infusions should be performed in SDS (outpatient) due to the safety of patient (unless there is a remarkable change/improvement in the pt's performance and mental status).   Daughter-Kathy expressed that she would like her "mother to be potentially treated and care transferred to [facility]."  Daughter inquired "if the type/screen drawn today could be used at [facility] if she would take mom there now for blood transfusion". I explained that [facility] blood bank would also require blood sample drawn for a type and cross.  Daughter Donia Guiles stated that she would like "mom to have blood transfusion either today or tomorrow at  [facility]."  RN Contacted SDS dept-spoke with charge nurse Jacqlyn Larsen, RN.  Nursing Hand off provided to [facility] in SDS at 1050 am.  SDS can accommodate pt tomorrow in SDS for 2 units.  Daughter instructed that blood transfusion (2 units administration) will be performed in SDS. Family instructed to arrive at the medical mall SDS dept (2nd floor) at [place]. Family instructed to have patient take routine medications and eat breakfast prior to arriving in [place]. Teach back process performed.

## 2017-02-27 ENCOUNTER — Other Ambulatory Visit
Admission: RE | Admit: 2017-02-27 | Discharge: 2017-02-27 | Disposition: A | Discharge status: Home / Self Care | Payer: Medicare Other | Source: Ambulatory Visit | Attending: Internal Medicine | Admitting: Internal Medicine

## 2017-02-27 ENCOUNTER — Ambulatory Visit
Admission: RE | Admit: 2017-02-27 | Discharge: 2017-02-27 | Disposition: A | Discharge status: Home / Self Care | Payer: Medicare Other | Source: Ambulatory Visit | Attending: Internal Medicine | Admitting: Internal Medicine

## 2017-02-27 ENCOUNTER — Other Ambulatory Visit: Payer: Self-pay | Admitting: Internal Medicine

## 2017-02-27 DIAGNOSIS — D649 Anemia, unspecified: Secondary | ICD-10-CM | POA: Diagnosis present

## 2017-02-27 DIAGNOSIS — R05 Cough: Secondary | ICD-10-CM

## 2017-02-27 DIAGNOSIS — R350 Frequency of micturition: Secondary | ICD-10-CM | POA: Diagnosis present

## 2017-02-27 DIAGNOSIS — I700 Atherosclerosis of aorta: Secondary | ICD-10-CM | POA: Insufficient documentation

## 2017-02-27 DIAGNOSIS — R918 Other nonspecific abnormal finding of lung field: Secondary | ICD-10-CM | POA: Insufficient documentation

## 2017-02-27 DIAGNOSIS — R059 Cough, unspecified: Secondary | ICD-10-CM

## 2017-02-27 DIAGNOSIS — D500 Iron deficiency anemia secondary to blood loss (chronic): Secondary | ICD-10-CM

## 2017-02-27 LAB — URINALYSIS, COMPLETE (UACMP) WITH MICROSCOPIC
Bilirubin Urine: NEGATIVE
GLUCOSE, UA: NEGATIVE mg/dL
HGB URINE DIPSTICK: NEGATIVE
Ketones, ur: NEGATIVE mg/dL
Leukocytes, UA: NEGATIVE
NITRITE: NEGATIVE
Protein, ur: NEGATIVE mg/dL
SPECIFIC GRAVITY, URINE: 1.015 (ref 1.005–1.030)
pH: 5 (ref 5.0–8.0)

## 2017-02-27 LAB — HEMOGLOBIN AND HEMATOCRIT, BLOOD
HCT: 29.9 % — ABNORMAL LOW (ref 35.0–47.0)
HEMOGLOBIN: 9.8 g/dL — AB (ref 12.0–16.0)

## 2017-02-27 LAB — PREPARE RBC (CROSSMATCH)

## 2017-02-27 MED ORDER — SODIUM CHLORIDE FLUSH 0.9 % IV SOLN
INTRAVENOUS | Status: AC
  Administered 2017-02-27: 10 mL
  Filled 2017-02-27: qty 10

## 2017-02-27 MED ORDER — HEPARIN SOD (PORK) LOCK FLUSH 100 UNIT/ML IV SOLN: 250.0000 [IU] | INTRAVENOUS | Status: DC | PRN

## 2017-02-27 MED ORDER — SODIUM CHLORIDE FLUSH 0.9 % IV SOLN
INTRAVENOUS | Status: AC
  Filled 2017-02-27: qty 10

## 2017-02-27 MED ORDER — HEPARIN SOD (PORK) LOCK FLUSH 100 UNIT/ML IV SOLN: 500.0000 [IU] | Freq: Every day | INTRAVENOUS | Status: DC | PRN

## 2017-02-27 MED ORDER — ACETAMINOPHEN 325 MG PO TABS: 650.0000 mg | ORAL_TABLET | Freq: Once | ORAL | Status: DC

## 2017-02-27 MED ORDER — FUROSEMIDE 10 MG/ML IJ SOLN
20.0000 mg | Freq: Once | INTRAMUSCULAR | Status: AC
Start: 2017-02-27 — End: 2017-02-27
  Administered 2017-02-27: 20 mg via INTRAVENOUS

## 2017-02-27 MED ORDER — SODIUM CHLORIDE 0.9% FLUSH: 3.0000 mL | INTRAVENOUS | Status: DC | PRN

## 2017-02-27 MED ORDER — SODIUM CHLORIDE 0.9 % IV SOLN
INTRAVENOUS | Status: DC
  Administered 2017-02-27: 10 mL/h via INTRAVENOUS

## 2017-02-27 MED ORDER — SODIUM CHLORIDE 0.9% FLUSH: 10.0000 mL | INTRAVENOUS | Status: DC | PRN

## 2017-02-27 MED ORDER — FUROSEMIDE 10 MG/ML IJ SOLN
INTRAMUSCULAR | Status: AC
  Administered 2017-02-27: 20 mg via INTRAVENOUS
  Filled 2017-02-27: qty 2

## 2017-02-27 MED ORDER — IPRATROPIUM-ALBUTEROL 0.5-2.5 (3) MG/3ML IN SOLN
RESPIRATORY_TRACT | Status: AC
  Administered 2017-02-27: 3 mL via RESPIRATORY_TRACT
  Filled 2017-02-27: qty 3

## 2017-02-27 MED ORDER — IPRATROPIUM-ALBUTEROL 0.5-2.5 (3) MG/3ML IN SOLN
3.0000 mL | Freq: Once | RESPIRATORY_TRACT | Status: AC
  Administered 2017-02-27: 3 mL via RESPIRATORY_TRACT

## 2017-02-27 MED ORDER — SODIUM CHLORIDE 0.9% FLUSH
10.0000 mL | INTRAVENOUS | Status: AC | PRN
  Administered 2017-02-27: 10 mL

## 2017-02-27 MED ORDER — SODIUM CHLORIDE 0.9 % IV SOLN: 250.0000 mL | Freq: Once | INTRAVENOUS | Status: DC

## 2017-02-27 NOTE — OR Nursing (Signed)
Telephone call to Dr. Dionne Bucy to inform him of current patient status with coughing, congestion and discomfort in chest due to constant coughing.  Provided him with her recent hgb results and he said to cancel second unit of blood. She should have her lasix iv now and also a duoneb treatment. Family aware of follow up appointments with oncology. Called daughter Juliann Pulse to give her an update and she said that [place], daughter, was trying to reach her physician to order a cxr prior to discharge. One of the sisters are due to return within the half hour to pick up patient.  She has had sips of apple juice, ice cream and apple sauce. Sitting upright in chair after using nebulizer.

## 2017-02-27 NOTE — OR Nursing (Signed)
Daughter Juliann Pulse came to pick up patient to take her to radiology for a cxr.  Informed her of what has transpired during the day. Discharged with family via wheelchair.

## 2017-02-27 NOTE — OR Nursing (Signed)
Patient continues to be comfortable. Refuses any food refreshments. Sleeping most of the time. O2 sats remain at 93-94%.

## 2017-02-27 NOTE — OR Nursing (Signed)
Patient arrived in [place] via wheelchair for blood transfusion today.  Daughter Donia Guiles with patient to assist as needed.  Patient coughing/congested.  Wheezing heard with inspiration and expiration but congestion mostly sounds to be from upper chest region only.  Encouraged patient to drink and will order lunch foods. Currently sleeping and compliant while first unit of blood going.  Hgb will need to be redrawn after first unit to see if her level is still within parameters for receiving a 2nd unit. Yesterday hgb was 8.1.  Patient does not present with any SOB but seems weak per daughter.

## 2017-02-27 NOTE — OR Nursing (Signed)
OOB to bathroom to void. Confused after waking up.  Cough is worsening. Congestion with rales/wheezing more uncomfortable for patient. Awaiting lab for blood draw to recheck her hgb. Encouraging liquids without success.

## 2017-02-28 LAB — BPAM RBC
Blood Product Expiration Date: 201803142359
ISSUE DATE / TIME: 201803071006
Unit Type and Rh: 600

## 2017-02-28 LAB — TYPE AND SCREEN
ABO/RH(D): A NEG
ANTIBODY SCREEN: NEGATIVE
Unit division: 0

## 2017-03-05 ENCOUNTER — Encounter: Payer: Self-pay | Admitting: *Deleted

## 2017-03-05 ENCOUNTER — Inpatient Hospital Stay: Payer: Medicare Other

## 2017-03-05 NOTE — Progress Notes (Signed)
Daughter's contacted cancer center. Family will cnl apt for patient today for lab/procrit as patient has been dx with "pneumonia"

## 2017-03-12 ENCOUNTER — Inpatient Hospital Stay: Payer: Medicare Other

## 2017-03-19 ENCOUNTER — Encounter: Payer: Self-pay | Admitting: *Deleted

## 2017-03-19 ENCOUNTER — Inpatient Hospital Stay: Payer: Medicare Other

## 2017-03-19 DIAGNOSIS — D500 Iron deficiency anemia secondary to blood loss (chronic): Secondary | ICD-10-CM | POA: Diagnosis not present

## 2017-03-19 DIAGNOSIS — Z7901 Long term (current) use of anticoagulants: Secondary | ICD-10-CM

## 2017-03-19 DIAGNOSIS — D638 Anemia in other chronic diseases classified elsewhere: Secondary | ICD-10-CM

## 2017-03-19 LAB — HEMATOCRIT: HCT: 36.2 % (ref 35.0–47.0)

## 2017-03-19 LAB — HEMOGLOBIN: HEMOGLOBIN: 11 g/dL — AB (ref 12.0–16.0)

## 2017-03-19 NOTE — Progress Notes (Signed)
Nurse called to lab to evaluate patient for lab draw. Poor peripheral access. Lab attempted venipuncture twice without successful lab draw. RN performed sucessful venipucture in right Antecubital space. hgb today 11.0./ hct 36.2.   Daughter informed that no procrit was needed today. Per daughter, pt is no longer on coumadin. For this reason, no pt/inr was performed. Dr. Rogue Bussing made aware - test not needed.  Daughter spoke with RN and provided new medication list.

## 2017-03-26 ENCOUNTER — Inpatient Hospital Stay: Payer: Medicare Other | Attending: Internal Medicine

## 2017-03-26 ENCOUNTER — Inpatient Hospital Stay (HOSPITAL_BASED_OUTPATIENT_CLINIC_OR_DEPARTMENT_OTHER): Payer: Medicare Other | Admitting: Internal Medicine

## 2017-03-26 ENCOUNTER — Inpatient Hospital Stay: Payer: Medicare Other

## 2017-03-26 VITALS — BP 107/66 | HR 72 | Temp 97.0°F | Resp 18 | Wt 109.6 lb

## 2017-03-26 DIAGNOSIS — I2510 Atherosclerotic heart disease of native coronary artery without angina pectoris: Secondary | ICD-10-CM | POA: Diagnosis not present

## 2017-03-26 DIAGNOSIS — F0390 Unspecified dementia without behavioral disturbance: Secondary | ICD-10-CM | POA: Diagnosis not present

## 2017-03-26 DIAGNOSIS — Z79899 Other long term (current) drug therapy: Secondary | ICD-10-CM

## 2017-03-26 DIAGNOSIS — I252 Old myocardial infarction: Secondary | ICD-10-CM | POA: Diagnosis not present

## 2017-03-26 DIAGNOSIS — E119 Type 2 diabetes mellitus without complications: Secondary | ICD-10-CM | POA: Diagnosis not present

## 2017-03-26 DIAGNOSIS — H409 Unspecified glaucoma: Secondary | ICD-10-CM | POA: Insufficient documentation

## 2017-03-26 DIAGNOSIS — I4891 Unspecified atrial fibrillation: Secondary | ICD-10-CM | POA: Insufficient documentation

## 2017-03-26 DIAGNOSIS — D509 Iron deficiency anemia, unspecified: Secondary | ICD-10-CM | POA: Diagnosis present

## 2017-03-26 DIAGNOSIS — Z8673 Personal history of transient ischemic attack (TIA), and cerebral infarction without residual deficits: Secondary | ICD-10-CM | POA: Diagnosis not present

## 2017-03-26 DIAGNOSIS — Z87891 Personal history of nicotine dependence: Secondary | ICD-10-CM | POA: Insufficient documentation

## 2017-03-26 DIAGNOSIS — D500 Iron deficiency anemia secondary to blood loss (chronic): Secondary | ICD-10-CM

## 2017-03-26 DIAGNOSIS — M1990 Unspecified osteoarthritis, unspecified site: Secondary | ICD-10-CM | POA: Insufficient documentation

## 2017-03-26 DIAGNOSIS — D638 Anemia in other chronic diseases classified elsewhere: Secondary | ICD-10-CM

## 2017-03-26 DIAGNOSIS — I10 Essential (primary) hypertension: Secondary | ICD-10-CM | POA: Diagnosis not present

## 2017-03-26 DIAGNOSIS — F419 Anxiety disorder, unspecified: Secondary | ICD-10-CM | POA: Diagnosis not present

## 2017-03-26 DIAGNOSIS — E7800 Pure hypercholesterolemia, unspecified: Secondary | ICD-10-CM | POA: Diagnosis not present

## 2017-03-26 LAB — SAMPLE TO BLOOD BANK

## 2017-03-26 LAB — HEMATOCRIT: HEMATOCRIT: 30.3 % — AB (ref 35.0–47.0)

## 2017-03-26 LAB — HEMOGLOBIN: HEMOGLOBIN: 9.3 g/dL — AB (ref 12.0–16.0)

## 2017-03-26 MED ORDER — EPOETIN ALFA 40000 UNIT/ML IJ SOLN
40000.0000 [IU] | INTRAMUSCULAR | Status: DC
  Administered 2017-03-26: 40000 [IU] via SUBCUTANEOUS

## 2017-03-26 NOTE — Progress Notes (Signed)
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   In the interim patient had a fall; unfortunately while on Coumadin- had facial fractures admitted to [facility]. Patient is currently taken off Coumadin. Patient also received 2 units of blood therefore hemoglobin around 6 as per the family.   As per the family patient is more short of breath.   No swelling in the legs. No pain. Denies any frank blood in stools or black stools.  REVIEW OF SYSTEMS:  Difficult to assess given her dementia.  PAST MEDICAL HISTORY :  Past Medical History:  Diagnosis Date  . Anxiety   . Aortic sclerosis   . AV malformation of gastrointestinal tract    AV malformations of the duodenum  . Coronary artery disease   . Dementia   . DM (diabetes mellitus) ([place])   . Elevated carcinoembryonic antigen (CEA)   . Glaucoma   . H/O bone scan 04/27/2011  . History of atrial fibrillation   . History of esophageal stricture   . History of MI (myocardial infarction)   . HTN (hypertension)   . Hypercholesteremia   . IDA (iron deficiency anemia)   . Osteoarthritis   . Osteoarthritis   . Osteoporosis   . Pulmonary nodules   . Stroke [facility])     PAST SURGICAL HISTORY :   Past Surgical History:  Procedure Laterality Date  . APPENDECTOMY    . COLONOSCOPY    . Endovascular Angioplasty Femoral/Popliteal Artery W/Stent/Atherectomy  1990  . POLYPECTOMY    . THYROIDECTOMY, PARTIAL    .  TONSILLECTOMY    . UPPER GASTROINTESTINAL ENDOSCOPY  2015    FAMILY HISTORY :   Family History  Problem Relation Age of Onset  . Coronary artery disease Mother   . Diabetes Mellitus II Mother   . Heart attack Father   . Heart attack Sister   . Aortic aneurysm Sister   . Prostate cancer Brother     brother #1  . Heart attack Brother     brother #1  . Prostate cancer Brother     brother #2  . Heart attack Brother     brother #2  . Heart attack Brother     brother #3    SOCIAL HISTORY:   Social History  Substance Use Topics  . Smoking status: Former Smoker    Packs/day: 0.50    Years: 40.00    Types: Cigarettes    Quit date: 75  . Smokeless tobacco: Never Used     Comment: quit in 1991  . Alcohol use No     Comment: previous ETOH abuse-stopped 3 years ago    ALLERGIES:  is allergic to penicillin g; sulfa antibiotics; ace inhibitors; and penicillin v potassium.  MEDICATIONS:  Current Outpatient Prescriptions  Medication Sig Dispense Refill  . acetaminophen (TYLENOL) 325 MG tablet Take 650 mg by mouth.    Marland Kitchen acetaminophen (TYLENOL) 500 MG tablet Take 1,000 mg by mouth.    Marland Kitchen  atorvastatin (LIPITOR) 20 MG tablet Take 20 mg by mouth.    . budesonide (PULMICORT) 0.5 MG/2ML nebulizer solution     . Calcium Carb-Cholecalciferol 600-800 MG-UNIT CHEW Chew 1 tablet by mouth at bedtime.    . CVS MELATONIN 3 MG TABS TAKE 3 TABLETS (9 MG TOTAL) BY MOUTH NIGHTLY AS NEEDED.  0  . donepezil (ARICEPT) 10 MG tablet Take 10 mg by mouth at bedtime.  6  . donepezil (ARICEPT) 10 MG tablet Take 10 mg by mouth.    . lidocaine (LIDODERM) 5 %     . metoprolol succinate (TOPROL-XL) 25 MG 24 hr tablet     . omeprazole (PRILOSEC) 10 MG capsule Take 20 mg by mouth.    . traMADol (ULTRAM) 50 MG tablet Take one half to one tablet every six hours as needed for pain.    Marland Kitchen albuterol (PROVENTIL) (2.5 MG/3ML) 0.083% nebulizer solution Inhale into the lungs.    Marland Kitchen albuterol (PROVENTIL) (5 MG/ML) 0.5%  nebulizer solution Inhale into the lungs.    . CVS SENNA 8.6 MG tablet Take 1 tablet by mouth at bedtime.  0  . epoetin alfa (EPOGEN,PROCRIT) 83151 UNIT/ML injection Inject into the skin.    Marland Kitchen nitroGLYCERIN (NITROSTAT) 0.4 MG SL tablet Place under the tongue.    . senna (SENOKOT) 8.6 MG tablet Take by mouth.     No current facility-administered medications for this visit.    Facility-Administered Medications Ordered in Other Visits  Medication Dose Route Frequency Provider Last Rate Last Dose  . 0.9 %  sodium chloride infusion  250 mL Intravenous Once Evlyn Kanner, NP      . acetaminophen (TYLENOL) tablet 650 mg  650 mg Oral Once Evlyn Kanner, NP      . diphenhydrAMINE (BENADRYL) capsule 25 mg  25 mg Oral Once Evlyn Kanner, NP      . epoetin alfa (EPOGEN,PROCRIT) injection 40,000 Units  40,000 Units Subcutaneous Weekly Cammie Sickle, MD   40,000 Units at 03/26/17 1042    PHYSICAL EXAMINATION: ECOG PERFORMANCE STATUS: 1 - Symptomatic but completely ambulatory  BP 107/66 (BP Location: Right Arm, Patient Position: Sitting)   Pulse 72   Temp 97 F (36.1 C) (Tympanic)   Resp 18   Wt 109 lb 9.1 oz (49.7 kg)   BMI 21.40 kg/m   Filed Weights   03/26/17 0938  Weight: 109 lb 9.1 oz (49.7 kg)    GENERAL: Well-nourished well-developed; Alert, no distress and comfortable.   Accompanied by her daughters.  EYES: positive for pallor OROPHARYNX: no thrush or ulceration. NECK: supple, no masses felt LYMPH:  no palpable lymphadenopathy in the cervical, axillary or inguinal regions LUNGS: Clear to auscultation bilaterally. No significant crackles noted. Positive for wheezing.  HEART/CVS: iregular rhythm and positive for systolic murmur; No lower extremity edema ABDOMEN:abdomen soft, non-tender and normal bowel sounds Musculoskeletal:no cyanosis of digits and no clubbing  PSYCH: alert & oriented x 0-1.   NEURO: no focal motor/sensory deficits SKIN:  no rashes or significant  lesions  LABORATORY DATA:  I have reviewed the data as listed    Component Value Date/Time   NA 140 12/11/2016 0855   NA 140 04/19/2015 0852   K 3.8 12/11/2016 0855   K 3.6 04/19/2015 0852   CL 105 12/11/2016 0855   CL 102 04/19/2015 0852   CO2 28 12/11/2016 0855   CO2 29 04/19/2015 0852   GLUCOSE 153 (H) 12/11/2016 0855   GLUCOSE 118 (H) 04/19/2015  0852   BUN 22 (H) 12/11/2016 0855   BUN 17 04/19/2015 0852   CREATININE 0.90 12/11/2016 0855   CREATININE 0.83 04/19/2015 0852   CALCIUM 9.5 12/11/2016 0855   CALCIUM 9.3 04/19/2015 0852   PROT 6.3 (L) 12/11/2016 0855   PROT 6.3 (L) 04/19/2015 0852   ALBUMIN 3.5 12/11/2016 0855   ALBUMIN 3.5 04/19/2015 0852   AST 23 12/11/2016 0855   AST 30 04/19/2015 0852   ALT 17 12/11/2016 0855   ALT 26 04/19/2015 0852   ALKPHOS 81 12/11/2016 0855   ALKPHOS 71 04/19/2015 0852   BILITOT 0.5 12/11/2016 0855   BILITOT 0.4 04/19/2015 0852   GFRNONAA 54 (L) 12/11/2016 0855   GFRNONAA >60 04/19/2015 0852   GFRAA >60 12/11/2016 0855   GFRAA >60 04/19/2015 0852    No results found for: SPEP, UPEP  Lab Results  Component Value Date   WBC 4.5 12/11/2016   NEUTROABS 3.5 12/11/2016   HGB 9.3 (L) 03/26/2017   HCT 30.3 (L) 03/26/2017   MCV 98.9 12/11/2016   PLT 233 12/11/2016      Chemistry      Component Value Date/Time   NA 140 12/11/2016 0855   NA 140 04/19/2015 0852   K 3.8 12/11/2016 0855   K 3.6 04/19/2015 0852   CL 105 12/11/2016 0855   CL 102 04/19/2015 0852   CO2 28 12/11/2016 0855   CO2 29 04/19/2015 0852   BUN 22 (H) 12/11/2016 0855   BUN 17 04/19/2015 0852   CREATININE 0.90 12/11/2016 0855   CREATININE 0.83 04/19/2015 0852      Component Value Date/Time   CALCIUM 9.5 12/11/2016 0855   CALCIUM 9.3 04/19/2015 0852   ALKPHOS 81 12/11/2016 0855   ALKPHOS 71 04/19/2015 0852   AST 23 12/11/2016 0855   AST 30 04/19/2015 0852   ALT 17 12/11/2016 0855   ALT 26 04/19/2015 0852   BILITOT 0.5 12/11/2016 0855   BILITOT 0.4  04/19/2015 0852        ASSESSMENT & PLAN:   Iron deficiency anemia due to chronic blood loss # Anemia- MDS/iron def Anemia- chronic GI blood loss versus other causes. IV Feraheme/Aranesp [Currently off Coumadin-see discussion below]; Hb- 9.3; recent iron studies from [facility] admission- low recommend IV Feraheme Today. Aranesp later this week.   # Recent stroke/A. fib- improved motor function; however, aphasia not improved. She is currently at home. Currently off Coumadin because of recent fall.  # Dementia/ recurrent fall- recommend discontinuation of Coumadin.  # Dementia/delirious/poor IV access; need for high acuity of nursing care needed interfering with administration of the blood products. If needed PRBC transfusion- w'll be admitted to the hospital  # Family not interested in any palliative care discussion.  #  weekly H&H ; Aranesp IV iron  as needed;  follow-up in 2 months. Unfortunately poor prognosis.  Dr.Holt/ [facility]; geriatric doc.      Cammie Sickle, MD 03/26/2017 12:50 PM

## 2017-03-26 NOTE — Assessment & Plan Note (Addendum)
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 IV Feraheme/Aranesp [Currently off Coumadin-see discussion below]; Hb- 9.3; recent iron studies from [facility] admission- low recommend IV Feraheme Today. Aranesp later this week.   # Recent stroke/A. fib- improved motor function; however, aphasia not improved. She is currently at home. Currently off Coumadin because of recent fall.  # Dementia/ recurrent fall- recommend discontinuation of Coumadin.  # Dementia/delirious/poor IV access; need for high acuity of nursing care needed interfering with administration of the blood products. If needed PRBC transfusion- w'll be admitted to the hospital  # Family not interested in any palliative care discussion.  #  weekly H&H ; Aranesp IV iron  as needed;  follow-up in 2 months. Unfortunately poor prognosis.  Dr.Holt/ [facility]; geriatric doc.

## 2017-03-26 NOTE — Progress Notes (Signed)
Here for follow up

## 2017-03-26 NOTE — Patient Instructions (Signed)
Epoetin Alfa injection °What is this medicine? °EPOETIN ALFA (e POE e tin AL fa) helps your body make more red blood cells. This medicine is used to treat anemia caused by chronic kidney failure, cancer chemotherapy, or HIV-therapy. It may also be used before surgery if you have anemia. °This medicine may be used for other purposes; ask your health care provider or pharmacist if you have questions. °COMMON BRAND NAME(S): Epogen, Procrit °What should I tell my health care provider before I take this medicine? °They need to know if you have any of these conditions: °-blood clotting disorders °-cancer patient not on chemotherapy °-cystic fibrosis °-heart disease, such as angina or heart failure °-hemoglobin level of 12 g/dL or greater °-high blood pressure °-low levels of folate, iron, or vitamin B12 °-seizures °-an unusual or allergic reaction to erythropoietin, albumin, benzyl alcohol, hamster proteins, other medicines, foods, dyes, or preservatives °-pregnant or trying to get pregnant °-breast-feeding °How should I use this medicine? °This medicine is for injection into a vein or under the skin. It is usually given by a health care professional in a hospital or clinic setting. °If you get this medicine at home, you will be taught how to prepare and give this medicine. Use exactly as directed. Take your medicine at regular intervals. Do not take your medicine more often than directed. °It is important that you put your used needles and syringes in a special sharps container. Do not put them in a trash can. If you do not have a sharps container, call your pharmacist or healthcare provider to get one. °A special MedGuide will be given to you by the pharmacist with each prescription and refill. Be sure to read this information carefully each time. °Talk to your pediatrician regarding the use of this medicine in children. While this drug may be prescribed for selected conditions, precautions do apply. °Overdosage: If you  think you have taken too much of this medicine contact a poison control center or emergency room at once. °NOTE: This medicine is only for you. Do not share this medicine with others. °What if I miss a dose? °If you miss a dose, take it as soon as you can. If it is almost time for your next dose, take only that dose. Do not take double or extra doses. °What may interact with this medicine? °Do not take this medicine with any of the following medications: °-darbepoetin alfa °This list may not describe all possible interactions. Give your health care provider a list of all the medicines, herbs, non-prescription drugs, or dietary supplements you use. Also tell them if you smoke, drink alcohol, or use illegal drugs. Some items may interact with your medicine. °What should I watch for while using this medicine? °Your condition will be monitored carefully while you are receiving this medicine. °You may need blood work done while you are taking this medicine. °What side effects may I notice from receiving this medicine? °Side effects that you should report to your doctor or health care professional as soon as possible: °-allergic reactions like skin rash, itching or hives, swelling of the face, lips, or tongue °-breathing problems °-changes in vision °-chest pain °-confusion, trouble speaking or understanding °-feeling faint or lightheaded, falls °-high blood pressure °-muscle aches or pains °-pain, swelling, warmth in the leg °-rapid weight gain °-severe headaches °-sudden numbness or weakness of the face, arm or leg °-trouble walking, dizziness, loss of balance or coordination °-seizures (convulsions) °-swelling of the ankles, feet, hands °-unusually weak or tired °  Side effects that usually do not require medical attention (report to your doctor or health care professional if they continue or are bothersome): °-diarrhea °-fever, chills (flu-like symptoms) °-headaches °-nausea, vomiting °-redness, stinging, or swelling at  site where injected °This list may not describe all possible side effects. Call your doctor for medical advice about side effects. You may report side effects to FDA at 1-800-FDA-1088. °Where should I keep my medicine? °Keep out of the reach of children. °Store in a refrigerator between 2 and 8 degrees C (36 and 46 degrees F). Do not freeze or shake. Throw away any unused portion if using a single-dose vial. Multi-dose vials can be kept in the refrigerator for up to 21 days after the initial dose. Throw away unused medicine. °NOTE: This sheet is a summary. It may not cover all possible information. If you have questions about this medicine, talk to your doctor, pharmacist, or health care provider. °© 2018 [employer]vier/[employer] (2016-07-30 19:42:31) ° °

## 2017-03-29 ENCOUNTER — Inpatient Hospital Stay: Payer: Medicare Other

## 2017-03-29 NOTE — Progress Notes (Signed)
Second attempt to give patient feraheme.  Unable again to obtain IV access.  Dr. Rogue Bussing aware.  Advised patient to take otc iron and keep next appointment per Dr Tish Men.

## 2017-04-02 ENCOUNTER — Inpatient Hospital Stay: Payer: Medicare Other

## 2017-04-02 VITALS — BP 106/71 | HR 84 | Resp 16

## 2017-04-02 DIAGNOSIS — D509 Iron deficiency anemia, unspecified: Secondary | ICD-10-CM | POA: Diagnosis not present

## 2017-04-02 DIAGNOSIS — D500 Iron deficiency anemia secondary to blood loss (chronic): Secondary | ICD-10-CM

## 2017-04-02 DIAGNOSIS — D638 Anemia in other chronic diseases classified elsewhere: Secondary | ICD-10-CM

## 2017-04-02 LAB — SAMPLE TO BLOOD BANK

## 2017-04-02 LAB — HEMOGLOBIN: Hemoglobin: 9.3 g/dL — ABNORMAL LOW (ref 12.0–16.0)

## 2017-04-02 LAB — HEMATOCRIT: HCT: 30.3 % — ABNORMAL LOW (ref 35.0–47.0)

## 2017-04-02 MED ORDER — EPOETIN ALFA 40000 UNIT/ML IJ SOLN
40000.0000 [IU] | INTRAMUSCULAR | Status: DC
  Administered 2017-04-02: 40000 [IU] via SUBCUTANEOUS
  Filled 2017-04-02: qty 1

## 2017-04-02 NOTE — Patient Instructions (Signed)
Epoetin Alfa injection °What is this medicine? °EPOETIN ALFA (e POE e tin AL fa) helps your body make more red blood cells. This medicine is used to treat anemia caused by chronic kidney failure, cancer chemotherapy, or HIV-therapy. It may also be used before surgery if you have anemia. °This medicine may be used for other purposes; ask your health care provider or pharmacist if you have questions. °COMMON BRAND NAME(S): Epogen, Procrit °What should I tell my health care provider before I take this medicine? °They need to know if you have any of these conditions: °-blood clotting disorders °-cancer patient not on chemotherapy °-cystic fibrosis °-heart disease, such as angina or heart failure °-hemoglobin level of 12 g/dL or greater °-high blood pressure °-low levels of folate, iron, or vitamin B12 °-seizures °-an unusual or allergic reaction to erythropoietin, albumin, benzyl alcohol, hamster proteins, other medicines, foods, dyes, or preservatives °-pregnant or trying to get pregnant °-breast-feeding °How should I use this medicine? °This medicine is for injection into a vein or under the skin. It is usually given by a health care professional in a hospital or clinic setting. °If you get this medicine at home, you will be taught how to prepare and give this medicine. Use exactly as directed. Take your medicine at regular intervals. Do not take your medicine more often than directed. °It is important that you put your used needles and syringes in a special sharps container. Do not put them in a trash can. If you do not have a sharps container, call your pharmacist or healthcare provider to get one. °A special MedGuide will be given to you by the pharmacist with each prescription and refill. Be sure to read this information carefully each time. °Talk to your pediatrician regarding the use of this medicine in children. While this drug may be prescribed for selected conditions, precautions do apply. °Overdosage: If you  think you have taken too much of this medicine contact a poison control center or emergency room at once. °NOTE: This medicine is only for you. Do not share this medicine with others. °What if I miss a dose? °If you miss a dose, take it as soon as you can. If it is almost time for your next dose, take only that dose. Do not take double or extra doses. °What may interact with this medicine? °Do not take this medicine with any of the following medications: °-darbepoetin alfa °This list may not describe all possible interactions. Give your health care provider a list of all the medicines, herbs, non-prescription drugs, or dietary supplements you use. Also tell them if you smoke, drink alcohol, or use illegal drugs. Some items may interact with your medicine. °What should I watch for while using this medicine? °Your condition will be monitored carefully while you are receiving this medicine. °You may need blood work done while you are taking this medicine. °What side effects may I notice from receiving this medicine? °Side effects that you should report to your doctor or health care professional as soon as possible: °-allergic reactions like skin rash, itching or hives, swelling of the face, lips, or tongue °-breathing problems °-changes in vision °-chest pain °-confusion, trouble speaking or understanding °-feeling faint or lightheaded, falls °-high blood pressure °-muscle aches or pains °-pain, swelling, warmth in the leg °-rapid weight gain °-severe headaches °-sudden numbness or weakness of the face, arm or leg °-trouble walking, dizziness, loss of balance or coordination °-seizures (convulsions) °-swelling of the ankles, feet, hands °-unusually weak or tired °  Side effects that usually do not require medical attention (report to your doctor or health care professional if they continue or are bothersome): °-diarrhea °-fever, chills (flu-like symptoms) °-headaches °-nausea, vomiting °-redness, stinging, or swelling at  site where injected °This list may not describe all possible side effects. Call your doctor for medical advice about side effects. You may report side effects to FDA at 1-800-FDA-1088. °Where should I keep my medicine? °Keep out of the reach of children. °Store in a refrigerator between 2 and 8 degrees C (36 and 46 degrees F). Do not freeze or shake. Throw away any unused portion if using a single-dose vial. Multi-dose vials can be kept in the refrigerator for up to 21 days after the initial dose. Throw away unused medicine. °NOTE: This sheet is a summary. It may not cover all possible information. If you have questions about this medicine, talk to your doctor, pharmacist, or health care provider. °© 2018 [employer]vier/[employer] (2016-07-30 19:42:31) ° °

## 2017-04-04 ENCOUNTER — Telehealth: Payer: Self-pay | Admitting: *Deleted

## 2017-04-04 NOTE — Telephone Encounter (Signed)
Patient has also elected for hospice services.-geriatrics provider ref. Patient to hospice per daughter

## 2017-04-04 NOTE — Telephone Encounter (Signed)
-----   Message from Secundino Ginger sent at 04/04/2017  1:50 PM EDT ----- Regarding: Tanya Townsend daughter called and said they are not going to put her through drawing blood and the pain anymore. They cancelled all her appts.

## 2017-04-09 ENCOUNTER — Inpatient Hospital Stay: Payer: Medicare Other

## 2017-04-16 ENCOUNTER — Inpatient Hospital Stay: Payer: Medicare Other

## 2017-04-23 ENCOUNTER — Inpatient Hospital Stay: Payer: Medicare Other

## 2017-04-30 ENCOUNTER — Inpatient Hospital Stay: Payer: Medicare Other

## 2017-05-07 ENCOUNTER — Inpatient Hospital Stay: Payer: Medicare Other

## 2017-05-14 ENCOUNTER — Inpatient Hospital Stay: Payer: Medicare Other

## 2017-05-21 ENCOUNTER — Other Ambulatory Visit: Payer: Medicare Other

## 2017-05-21 ENCOUNTER — Ambulatory Visit: Payer: Medicare Other

## 2017-05-21 ENCOUNTER — Ambulatory Visit: Payer: Medicare Other | Admitting: Internal Medicine

## 2017-07-14 ENCOUNTER — Inpatient Hospital Stay
Admission: EM | Admit: 2017-07-14 | Discharge: 2017-07-24 | DRG: 189 | Disposition: E | Attending: Internal Medicine | Admitting: Internal Medicine

## 2017-07-14 ENCOUNTER — Emergency Department

## 2017-07-14 DIAGNOSIS — E7800 Pure hypercholesterolemia, unspecified: Secondary | ICD-10-CM | POA: Diagnosis present

## 2017-07-14 DIAGNOSIS — Z882 Allergy status to sulfonamides status: Secondary | ICD-10-CM

## 2017-07-14 DIAGNOSIS — N179 Acute kidney failure, unspecified: Secondary | ICD-10-CM | POA: Diagnosis present

## 2017-07-14 DIAGNOSIS — I110 Hypertensive heart disease with heart failure: Secondary | ICD-10-CM | POA: Diagnosis present

## 2017-07-14 DIAGNOSIS — Z8249 Family history of ischemic heart disease and other diseases of the circulatory system: Secondary | ICD-10-CM

## 2017-07-14 DIAGNOSIS — I5033 Acute on chronic diastolic (congestive) heart failure: Secondary | ICD-10-CM | POA: Diagnosis present

## 2017-07-14 DIAGNOSIS — J9600 Acute respiratory failure, unspecified whether with hypoxia or hypercapnia: Secondary | ICD-10-CM | POA: Diagnosis present

## 2017-07-14 DIAGNOSIS — I248 Other forms of acute ischemic heart disease: Secondary | ICD-10-CM | POA: Diagnosis present

## 2017-07-14 DIAGNOSIS — D509 Iron deficiency anemia, unspecified: Secondary | ICD-10-CM | POA: Diagnosis present

## 2017-07-14 DIAGNOSIS — Z66 Do not resuscitate: Secondary | ICD-10-CM | POA: Diagnosis present

## 2017-07-14 DIAGNOSIS — J9601 Acute respiratory failure with hypoxia: Secondary | ICD-10-CM | POA: Diagnosis present

## 2017-07-14 DIAGNOSIS — Z888 Allergy status to other drugs, medicaments and biological substances status: Secondary | ICD-10-CM | POA: Diagnosis not present

## 2017-07-14 DIAGNOSIS — Z8673 Personal history of transient ischemic attack (TIA), and cerebral infarction without residual deficits: Secondary | ICD-10-CM | POA: Diagnosis not present

## 2017-07-14 DIAGNOSIS — I252 Old myocardial infarction: Secondary | ICD-10-CM | POA: Diagnosis not present

## 2017-07-14 DIAGNOSIS — I350 Nonrheumatic aortic (valve) stenosis: Secondary | ICD-10-CM | POA: Diagnosis present

## 2017-07-14 DIAGNOSIS — I4891 Unspecified atrial fibrillation: Secondary | ICD-10-CM

## 2017-07-14 DIAGNOSIS — F0390 Unspecified dementia without behavioral disturbance: Secondary | ICD-10-CM | POA: Diagnosis present

## 2017-07-14 DIAGNOSIS — E119 Type 2 diabetes mellitus without complications: Secondary | ICD-10-CM | POA: Diagnosis present

## 2017-07-14 DIAGNOSIS — Z880 Allergy status to penicillin: Secondary | ICD-10-CM

## 2017-07-14 DIAGNOSIS — Z87891 Personal history of nicotine dependence: Secondary | ICD-10-CM

## 2017-07-14 DIAGNOSIS — R0602 Shortness of breath: Secondary | ICD-10-CM | POA: Diagnosis present

## 2017-07-14 DIAGNOSIS — I2510 Atherosclerotic heart disease of native coronary artery without angina pectoris: Secondary | ICD-10-CM | POA: Diagnosis present

## 2017-07-14 DIAGNOSIS — Z7951 Long term (current) use of inhaled steroids: Secondary | ICD-10-CM

## 2017-07-14 DIAGNOSIS — Z515 Encounter for palliative care: Secondary | ICD-10-CM | POA: Diagnosis present

## 2017-07-14 DIAGNOSIS — I482 Chronic atrial fibrillation: Secondary | ICD-10-CM | POA: Diagnosis present

## 2017-07-14 LAB — COMPREHENSIVE METABOLIC PANEL
ALK PHOS: 82 U/L (ref 38–126)
ALT: 15 U/L (ref 14–54)
AST: 57 U/L — ABNORMAL HIGH (ref 15–41)
Albumin: 3.4 g/dL — ABNORMAL LOW (ref 3.5–5.0)
Anion gap: 21 — ABNORMAL HIGH (ref 5–15)
BILIRUBIN TOTAL: 0.7 mg/dL (ref 0.3–1.2)
BUN: 27 mg/dL — ABNORMAL HIGH (ref 6–20)
CALCIUM: 9.4 mg/dL (ref 8.9–10.3)
CO2: 11 mmol/L — AB (ref 22–32)
CREATININE: 1.43 mg/dL — AB (ref 0.44–1.00)
Chloride: 112 mmol/L — ABNORMAL HIGH (ref 101–111)
GFR, EST AFRICAN AMERICAN: 35 mL/min — AB (ref >60)
GFR, EST NON AFRICAN AMERICAN: 31 mL/min — AB (ref >60)
Glucose, Bld: 236 mg/dL — ABNORMAL HIGH (ref 65–99)
Potassium: 5 mmol/L (ref 3.5–5.1)
SODIUM: 144 mmol/L (ref 135–145)
TOTAL PROTEIN: 6.4 g/dL — AB (ref 6.5–8.1)

## 2017-07-14 LAB — CBC

## 2017-07-14 LAB — TROPONIN I: TROPONIN I: 0.2 ng/mL — AB (ref <0.03)

## 2017-07-14 LAB — BRAIN NATRIURETIC PEPTIDE: B Natriuretic Peptide: 4239 pg/mL — ABNORMAL HIGH (ref 0.0–100.0)

## 2017-07-14 MED ORDER — MIDAZOLAM HCL 5 MG/5ML IJ SOLN
INTRAMUSCULAR | Status: AC
  Filled 2017-07-14: qty 5

## 2017-07-14 MED ORDER — DILTIAZEM HCL 100 MG IV SOLR
5.0000 mg/h | INTRAVENOUS | Status: DC
  Filled 2017-07-14 (×2): qty 100

## 2017-07-14 MED ORDER — METOPROLOL TARTRATE 5 MG/5ML IV SOLN
5.0000 mg | Freq: Once | INTRAVENOUS | Status: AC
  Administered 2017-07-14: 5 mg via INTRAVENOUS

## 2017-07-14 MED ORDER — MORPHINE SULFATE 10 MG/5ML PO SOLN
2.5000 mg | ORAL | Status: DC | PRN
  Administered 2017-07-14: 2.5 mg via ORAL
  Filled 2017-07-14: qty 5

## 2017-07-14 MED ORDER — METOPROLOL TARTRATE 5 MG/5ML IV SOLN
INTRAVENOUS | Status: AC
  Administered 2017-07-14: 5 mg via INTRAVENOUS
  Filled 2017-07-14: qty 5

## 2017-07-14 MED ORDER — ACETAMINOPHEN 650 MG RE SUPP: 650.0000 mg | Freq: Four times a day (QID) | RECTAL | Status: DC | PRN

## 2017-07-14 MED ORDER — ONDANSETRON HCL 4 MG PO TABS: 4.0000 mg | ORAL_TABLET | Freq: Four times a day (QID) | ORAL | Status: DC | PRN

## 2017-07-14 MED ORDER — SODIUM CHLORIDE 0.9 % IV BOLUS (SEPSIS)
1000.0000 mL | Freq: Once | INTRAVENOUS | Status: AC
  Administered 2017-07-14: 1000 mL via INTRAVENOUS

## 2017-07-14 MED ORDER — SODIUM CHLORIDE 0.9 % IV SOLN: INTRAVENOUS | Status: DC

## 2017-07-14 MED ORDER — ACETAMINOPHEN 325 MG PO TABS
650.0000 mg | ORAL_TABLET | Freq: Four times a day (QID) | ORAL | Status: DC | PRN
Start: 2017-07-14 — End: 2017-07-15

## 2017-07-14 MED ORDER — SENNOSIDES-DOCUSATE SODIUM 8.6-50 MG PO TABS
1.0000 | ORAL_TABLET | Freq: Every evening | ORAL | Status: DC | PRN
Start: 2017-07-14 — End: 2017-07-14

## 2017-07-14 MED ORDER — ONDANSETRON HCL 4 MG/2ML IJ SOLN: 4.0000 mg | Freq: Four times a day (QID) | INTRAMUSCULAR | Status: DC | PRN

## 2017-07-14 MED ORDER — MORPHINE SULFATE 10 MG/5ML PO SOLN
2.5000 mg | ORAL | Status: DC | PRN
  Administered 2017-07-14: 21:00:00 2.5 mg via ORAL
  Filled 2017-07-14: qty 5

## 2017-07-14 MED ORDER — HYDROCODONE-ACETAMINOPHEN 5-325 MG PO TABS: 1.0000 | ORAL_TABLET | ORAL | Status: DC | PRN

## 2017-07-14 MED ORDER — LORAZEPAM 0.5 MG PO TABS
0.5000 mg | ORAL_TABLET | ORAL | Status: DC | PRN
  Filled 2017-07-14: qty 1

## 2017-07-14 MED ORDER — LORAZEPAM 2 MG/ML IJ SOLN
0.5000 mg | INTRAMUSCULAR | Status: DC | PRN
  Administered 2017-07-14: 0.5 mg via INTRAVENOUS
  Filled 2017-07-14: qty 1

## 2017-07-14 MED ORDER — MORPHINE SULFATE (PF) 2 MG/ML IV SOLN
1.0000 mg | INTRAVENOUS | Status: DC | PRN
  Administered 2017-07-14: 1 mg via INTRAVENOUS
  Filled 2017-07-14: qty 1

## 2017-07-14 NOTE — Progress Notes (Signed)
Yellow, red and purple armband applied

## 2017-07-14 NOTE — ED Notes (Addendum)
Charted in error.

## 2017-07-14 NOTE — ED Notes (Signed)
Pt taken off BiPap

## 2017-07-14 NOTE — ED Notes (Signed)
External urinary catheter placed for pt's comfort and urinary frequency, pt tol well

## 2017-07-14 NOTE — ED Notes (Signed)
Placed female external catheter on patient.

## 2017-07-14 NOTE — ED Notes (Signed)
RN called Lab to ask for assistance with obtaining blood work needed

## 2017-07-14 NOTE — H&P (Signed)
[facility] [place] NAME: Tanya Townsend    MR#:  [id number]  DATE OF BIRTH:  [birth year]  DATE OF ADMISSION:  07/07/2017  PRIMARY CARE PHYSICIAN: [facility]   REQUESTING/REFERRING PHYSICIAN: Dr. Corky Downs  CHIEF COMPLAINT:   Shortness of breath HISTORY OF PRESENT ILLNESS:  Tanya Townsend  is a 83 y.o. 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 Patient was also found to be atrial fibrillation and RVR with heart rates in the 130s to 140s. She is placed on BiPAP for her acute hypoxic respiratory failure. Chest x-ray shows very mild pulmonary edema. Family is at bedside and their preference per patient is to remain on BiPAP overnight and have a more comfort approach to her hospitalization. Patient is DO NOT RESUSCITATE.  PAST MEDICAL HISTORY:   Past Medical History:  Diagnosis Date  . Anxiety   . Aortic sclerosis   . AV malformation of gastrointestinal tract    AV malformations of the duodenum  . Coronary artery disease   . Dementia   . DM (diabetes mellitus) ([place])   . Elevated carcinoembryonic antigen (CEA)   . Glaucoma   . H/O bone scan 04/27/2011  . History of atrial fibrillation   . History of esophageal stricture   . History of MI (myocardial infarction)   . HTN (hypertension)   . Hypercholesteremia   . IDA (iron deficiency anemia)   . Osteoarthritis   . Osteoarthritis   . Osteoporosis   . Pulmonary nodules   . Stroke [facility])     PAST SURGICAL HISTORY:   Past Surgical History:  Procedure Laterality Date  . APPENDECTOMY    . COLONOSCOPY    . Endovascular Angioplasty Femoral/Popliteal Artery W/Stent/Atherectomy  1990  . POLYPECTOMY    . THYROIDECTOMY, PARTIAL    . TONSILLECTOMY    . UPPER GASTROINTESTINAL ENDOSCOPY  2015    SOCIAL HISTORY:   Social History   Substance Use Topics  . Smoking status: Former Smoker    Packs/day: 0.50    Years: 40.00    Types: Cigarettes    Quit date: 49  . Smokeless tobacco: Never Used     Comment: quit in 1991  . Alcohol use No     Comment: previous ETOH abuse-stopped 3 years ago    FAMILY HISTORY:   Family History  Problem Relation Age of Onset  . Coronary artery disease Mother   . Diabetes Mellitus II Mother   . Heart attack Father   . Heart attack Sister   . Aortic aneurysm Sister   . Prostate cancer Brother        brother #1  . Heart attack Brother        brother #1  . Prostate cancer Brother        brother #2  . Heart attack Brother        brother #2  . Heart attack Brother        brother #3    DRUG ALLERGIES:   Allergies  Allergen Reactions  . Penicillin G Anaphylaxis    .Has patient had a PCN reaction causing immediate rash, facial/tongue/throat swelling, SOB or lightheadedness with hypotension: Yes Has patient had a PCN reaction causing severe rash involving mucus membranes or skin necrosis: Unknown Has patient had a PCN reaction that required hospitalization: Unknown Has patient had  a PCN reaction occurring within the last 10 years: Unknown If all of the above answers are "NO", then may proceed with Cephalosporin use.   . Sulfa Antibiotics Anaphylaxis  . Ace Inhibitors     Other reaction(s): Cough (finding)  . Penicillin V Potassium     Other reaction(s): Unknown Patient tolerates Augmentin     REVIEW OF SYSTEMS:   Review of Systems  Unable to perform ROS: Dementia    MEDICATIONS AT HOME:   Prior to Admission medications   Medication Sig Start Date End Date Taking? Authorizing Provider  acetaminophen (TYLENOL) 325 MG tablet Take 650 mg by mouth every 6 (six) hours as needed for mild pain or moderate pain.  03/21/16  Yes [provider]  acetaminophen (TYLENOL) 500 MG tablet Take 1,000 mg by mouth every 8 (eight) hours as needed.  03/15/17  Yes [provider]  albuterol (PROVENTIL) (2.5 MG/3ML) 0.083% nebulizer solution Inhale 2.5 mg into the lungs every 4 (four) hours as needed for wheezing or shortness of breath.    Yes [provider]  budesonide (PULMICORT) 0.5 MG/2ML nebulizer solution Take 0.5 mg by nebulization 2 (two) times daily.  03/08/17  Yes [provider]  Calcium Carb-Cholecalciferol 600-800 MG-UNIT CHEW Chew 1 tablet by mouth at bedtime.   Yes [provider]  [employer] GENTLE LAXATIVE 10 MG suppository Place 10 mg rectally daily as needed. 07/12/17  Yes [provider]  [facility]S MELATONIN 3 MG TABS TAKE 3 TABLETS (9 MG TOTAL) BY MOUTH NIGHTLY AS NEEDED. 03/15/17  Yes [provider]  donepezil (ARICEPT) 10 MG tablet Take 10 mg by mouth at bedtime. 02/07/15  Yes [provider]  metoprolol succinate (TOPROL-XL) 25 MG 24 hr tablet Take 25 mg by mouth daily.  03/15/17  Yes [provider]  nitroGLYCERIN (NITROSTAT) 0.4 MG SL tablet Place 0.4 mg under the tongue every 5 (five) minutes x 3 doses as needed for chest pain. 07/10/17  Yes [provider]  epoetin alfa (EPOGEN,PROCRIT) 33295 UNIT/ML injection Inject into the skin. 01/15/17   [provider]  omeprazole (PRILOSEC) 10 MG capsule Take 20 mg by mouth.    [provider]      VITAL SIGNS:  Blood pressure (!) 79/59, pulse (!) 141, resp. rate (!) 29, SpO2 100 %.  PHYSICAL EXAMINATION:   Physical Exam  Constitutional: She is well-developed, well-nourished, and in no distress. No distress.  Increased work of breathing wearing BiPAP  HENT:  Head: Normocephalic.  Eyes: No scleral icterus.  Neck: Normal range of motion. Neck supple. No JVD present. No tracheal deviation present.  Cardiovascular: Exam reveals no gallop and no friction rub.   Murmur heard. Irregular, irregular with tachycardia  Pulmonary/Chest: She is in respiratory distress. She has no wheezes. She has no rales. She exhibits no  tenderness.  Coarse crackles and rales bilaterally  Abdominal: Soft. Bowel sounds are normal. She exhibits no distension and no mass. There is no tenderness. There is no rebound and no guarding.  Musculoskeletal: Normal range of motion. She exhibits edema (mild).  Neurological: She is alert.  Skin: Skin is warm. No rash noted. No erythema.  Psychiatric:  Dementia, confusion      LABORATORY PANEL:   CBC No results for input(s): WBC, HGB, HCT, PLT in the last 168 hours. ------------------------------------------------------------------------------------------------------------------  Chemistries   Recent Labs Lab 07/06/2017 1240  NA 144  K 5.0  CL 112*  CO2 11*  GLUCOSE 236*  BUN 27*  CREATININE 1.43*  CALCIUM 9.4  AST 57*  ALT 15  ALKPHOS 82  BILITOT 0.7   ------------------------------------------------------------------------------------------------------------------  Cardiac Enzymes  Recent Labs Lab 07/11/2017 1240  TROPONINI 0.20*   ------------------------------------------------------------------------------------------------------------------  RADIOLOGY:  Dg Chest Portable 1 View  Result Date: 06/23/2017 CLINICAL DATA:  Unresponsive, history coronary artery disease post MI, atrial fibrillation, hypertension, diabetes mellitus, stroke EXAM: PORTABLE CHEST 1 VIEW COMPARISON:  Portable exam 1218 hours compared to 02/27/2017 FINDINGS: Enlargement of cardiac silhouette with minimal pulmonary vascular congestion. Atherosclerotic calcification aorta. RIGHT pleural effusion and basilar atelectasis. Central peribronchial thickening and question minimal perihilar edema RIGHT greater than LEFT. Probable small LEFT pleural effusion as well. Upper lungs clear. No pneumothorax. Bones demineralized. IMPRESSION: Enlargement of cardiac silhouette with pulmonary vascular congestion and question minimal pulmonary edema. RIGHT pleural effusion and basilar atelectasis with probable  smaller LEFT pleural effusion. Aortic Atherosclerosis (ICD10-I70.0). Electronically Signed   By: Lavonia Dana M.D.   On: 06/25/2017 12:38    EKG:  Atrial fibrillation with heart rate 1:30 PVCs  IMPRESSION AND PLAN:   83 year old female with chronic atrial fibrillation, AVM with history of GI bleed currently in hospice care who presents with acute hypoxic respiratory failure and atrial fibrillation with RVR.  1. Acute hypoxic respiratory failure in the setting of pulmonary edema and atrial fibrillation with RVR Patient is currently on BiPAP. Family is interested in more comfort care approach. We will continue BiPAP through the night with trying to wean to nasal cannula. Patient is DO NOT RESUSCITATE and therefore no intubation or pressors.  2. Acute on chronic diastolic heart failure: Continue BiPAP  3. Severe aortic stenosis  4. Atrial fibrillation with RVR: Family would like more comfort approach  5. Acute kidney injury: Very gentle IV fluids per family request  6. Elevated troponin due to demand ischemia   All the records are reviewed and case discussed with ED provider. Management plans discussed with the patient's family and they are in agreement  CODE STATUS: DNR  TOTAL TIME TAKING CARE OF THIS PATIENT: 50 minutes.    Ashlyn Cabler M.D on 07/11/2017 at 2:30 PM  Between 7am to 6pm - Pager - [phone]  After 6pm go to [email or web address] - password [email or web address] [facility]  Office  [phone]  CC: Primary care physician; [facility]

## 2017-07-14 NOTE — ED Notes (Signed)
Tanya Townsend from Lab here drawing blood

## 2017-07-14 NOTE — ED Triage Notes (Addendum)
Pt came in as emergency traffic.  Pt comes from home and has a caregiver with her there.  Per EMS pt states that her nurse aid was taking her to the bathroom.  When the aid came back to check on her she was unresponsive.  Pt is not normally on oxygen and came in on 10L non rebreather.  Pt is pale.  EMS gave pt 1/2mg  of Narcan in route.  Pt is a DNR.  Pt has a lidocaine patch on back for arthritis.  Pt has a hx of stroke and UTIs.

## 2017-07-14 NOTE — Progress Notes (Signed)
Family Meeting Note  Advance Directive: yes  Today a meeting took place with the patient's daughters Patient is unable to participate due [place]:TVNRWC capacity dementia   The following clinical team members were present during this meeting:MD  The following were discussed:Patient's diagnosis: Acute hypoxic respiratory failure Atrial fibrillation with RVR Severe aortic stenosis Acute on chronic diastolic heart failure   , Patient's progosis: < 2 weeks and Goals for treatment: DNR  Additional follow-up to be provided: Palliative care/hospice  Time spent during discussion:20 minutes  Gissell Barra, MD

## 2017-07-14 NOTE — ED Notes (Signed)
The EKG was completed and signed by Dr. Kinner. The EKG was also exported into the system. 

## 2017-07-14 NOTE — ED Notes (Signed)
Spoke with Dr. Benjie Karvonen re: patient's low BP, no new orders at this time. Keep fluids at 30 ml per hour.

## 2017-07-14 NOTE — ED Provider Notes (Signed)
[facility] Emergency Department Provider Note   ____________________________________________    I have reviewed the triage vital signs and the nursing notes.   HISTORY  Chief Complaint Loss of Consciousness     HPI Tanya Townsend is a 83 y.o. female who presents with respiratory distress, found unresponsive by family today. The patient has a history of dementia and is unable to provide significant history. Family reports patient has a history of aortic stenosis, has had GI bleeding in the past, has diabetes. Today they found her essentially unresponsive. EMS found her to be very hypoxic started her on nonrebreather oxygen. They also found her to be in atrial fibrillation with RVR.    Past Medical History:  Diagnosis Date  . Anxiety   . Aortic sclerosis   . AV malformation of gastrointestinal tract    AV malformations of the duodenum  . Coronary artery disease   . Dementia   . DM (diabetes mellitus) ([place])   . Elevated carcinoembryonic antigen (CEA)   . Glaucoma   . H/O bone scan 04/27/2011  . History of atrial fibrillation   . History of esophageal stricture   . History of MI (myocardial infarction)   . HTN (hypertension)   . Hypercholesteremia   . IDA (iron deficiency anemia)   . Osteoarthritis   . Osteoarthritis   . Osteoporosis   . Pulmonary nodules   . Stroke [facility])     Patient Active Problem List   Diagnosis Date Noted  . Iron deficiency anemia due to chronic blood loss 01/01/2017  . Abnormal presence of protein in urine 04/24/2015  . OP (osteoporosis) 04/24/2015  . Arthritis, degenerative 04/24/2015  . Esophageal stenosis 04/24/2015  . Anemia, iron deficiency 04/24/2015  . Arteriosclerosis of coronary artery 04/24/2015  . Aortic sclerosis 04/24/2015  . Anxiety 04/24/2015  . Allergic rhinitis 04/24/2015  . Anemia of chronic disease 04/21/2015  . Diabetes ([place]) 03/30/2014  . Hypercholesterolemia without  hypertriglyceridemia 03/30/2014  . LBP (low back pain) 03/30/2014  . Benign essential HTN 03/30/2014  . Can't get food down 03/30/2014  . Inflammatory polyarthritis ([place]) 03/30/2014  . A-fib ([place]) 03/30/2014  . Absolute anemia 03/30/2014    Past Surgical History:  Procedure Laterality Date  . APPENDECTOMY    . COLONOSCOPY    . Endovascular Angioplasty Femoral/Popliteal Artery W/Stent/Atherectomy  1990  . POLYPECTOMY    . THYROIDECTOMY, PARTIAL    . TONSILLECTOMY    . UPPER GASTROINTESTINAL ENDOSCOPY  2015    Prior to Admission medications   Medication Sig Start Date End Date Taking? Authorizing Provider  acetaminophen (TYLENOL) 325 MG tablet Take 650 mg by mouth. 03/21/16   [provider]  acetaminophen (TYLENOL) 500 MG tablet Take 1,000 mg by mouth. 03/15/17   [provider]  albuterol (PROVENTIL) (2.5 MG/3ML) 0.083% nebulizer solution Inhale into the lungs.    [provider]  albuterol (PROVENTIL) (5 MG/ML) 0.5% nebulizer solution Inhale into the lungs. 01/12/17 01/12/18  [provider]  atorvastatin (LIPITOR) 20 MG tablet Take 20 mg by mouth.    [provider]  budesonide (PULMICORT) 0.5 MG/2ML nebulizer solution  03/08/17   [provider]  Calcium Carb-Cholecalciferol 600-800 MG-UNIT CHEW Chew 1 tablet by mouth at bedtime.    [provider]  CVS MELATONIN 3 MG TABS TAKE 3 TABLETS (9 MG TOTAL) BY MOUTH NIGHTLY AS NEEDED. 03/15/17   [provider]  [facility]S SENNA 8.6 MG tablet Take 1 tablet by mouth  at bedtime. 03/15/17   [provider]  donepezil (ARICEPT) 10 MG tablet Take 10 mg by mouth at bedtime. 02/07/15   [provider]  donepezil (ARICEPT) 10 MG tablet Take 10 mg by mouth. 01/12/17 01/12/18  [provider]  epoetin alfa (EPOGEN,PROCRIT) 87681 UNIT/ML injection Inject into the skin. 01/15/17   [provider]  lidocaine (LIDODERM) 5 %  03/15/17   [provider]    metoprolol succinate (TOPROL-XL) 25 MG 24 hr tablet  03/15/17   [provider]  nitroGLYCERIN (NITROSTAT) 0.4 MG SL tablet Place under the tongue. 11/08/15 11/07/16  [provider]  omeprazole (PRILOSEC) 10 MG capsule Take 20 mg by mouth.    [provider]  traMADol (ULTRAM) 50 MG tablet Take one half to one tablet every six hours as needed for pain. 03/15/17   [provider]     Allergies Penicillin g; Sulfa antibiotics; Ace inhibitors; and Penicillin v potassium  Family History  Problem Relation Age of Onset  . Coronary artery disease Mother   . Diabetes Mellitus II Mother   . Heart attack Father   . Heart attack Sister   . Aortic aneurysm Sister   . Prostate cancer Brother        brother #1  . Heart attack Brother        brother #1  . Prostate cancer Brother        brother #2  . Heart attack Brother        brother #2  . Heart attack Brother        brother #3    Social History Social History  Substance Use Topics  . Smoking status: Former Smoker    Packs/day: 0.50    Years: 40.00    Types: Cigarettes    Quit date: 55  . Smokeless tobacco: Never Used     Comment: quit in 1991  . Alcohol use No     Comment: previous ETOH abuse-stopped 3 years ago    Level V caveat: Unable to obtain Review of Systems due to patient distress     ____________________________________________   PHYSICAL EXAM:  VITAL SIGNS: ED Triage Vitals  Enc Vitals Group     BP 07/11/2017 1218 101/69     Pulse Rate 07/17/2017 1216 (!) 112     Resp 07/03/2017 1216 (!) 31     Temp --      Temp src --      SpO2 06/29/2017 1216 (!) 83 %     Weight --      Height --      Head Circumference --      Peak Flow --      Pain Score --      Pain Loc --      Pain Edu? --      Excl. in [place]? --     Constitutional: Alert But in significant distress Eyes: Conjunctivae are normal.  Head: Atraumatic.  Mouth/Throat: Mucous membranes are dry  Neck:  Painless  ROM Cardiovascular: Tachycardia, irregularly irregular rhythm, systolic murmur  Good peripheral circulation. Respiratory: Increased respiratory effort with tachypnea and retractions, bibasilar rales Gastrointestinal: Soft and nontender. No distention.  Genitourinary: deferred Musculoskeletal: No lower extremity tenderness nor edema.  Warm and well perfused Neurologic:  No gross focal neurologic deficits are appreciated.  Skin:  Skin is warm, dry and intact. No rash noted.   ____________________________________________   LABS (all labs ordered are listed, but only abnormal results  are displayed)  Labs Reviewed  COMPREHENSIVE METABOLIC PANEL - Abnormal; Notable for the following:       Result Value   Chloride 112 (*)    CO2 11 (*)    Glucose, Bld 236 (*)    BUN 27 (*)    Creatinine, Ser 1.43 (*)    Total Protein 6.4 (*)    Albumin 3.4 (*)    AST 57 (*)    GFR calc non Af Amer 31 (*)    GFR calc Af Amer 35 (*)    Anion gap 21 (*)    All other components within normal limits  TROPONIN I - Abnormal; Notable for the following:    Troponin I 0.20 (*)    All other components within normal limits  CULTURE, BLOOD (ROUTINE X 2)  CULTURE, BLOOD (ROUTINE X 2)  BRAIN NATRIURETIC PEPTIDE  CBC  TYPE AND SCREEN   ____________________________________________  EKG  ED ECG REPORT I, Lavonia Drafts, the attending physician, personally viewed and interpreted this ECG.  Date: 06/29/2017  Rhythm: Atrial fibrillation with RVR QRS Axis: normal Intervals: Abnormal ST/T Wave abnormalities: Nonspecific changes Narrative Interpretation: A. fib with RVR  ____________________________________________  RADIOLOGY  Chest x-ray shows pulmonary vascular congestion, pleural effusion ____________________________________________   PROCEDURES  Procedure(s) performed: yes  Angiocath insertion Performed by: Lavonia Drafts  Consent: Verbal consent obtained. Risks and benefits: risks,  benefits and alternatives were discussed Time out: Immediately prior to procedure a "time out" was called to verify the correct patient, procedure, equipment, support staff and site/side marked as required.  Preparation: Patient was prepped and draped in the usual sterile fashion.  Vein Location: right AC  Ultrasound Guided  Gauge: 20  Normal blood return and flush without difficulty Patient tolerance: Patient tolerated the procedure well with no immediate complications.       Critical Care performed: yes  CRITICAL CARE Performed by: Lavonia Drafts   Total critical care time: 30 minutes  Critical care time was exclusive of separately billable procedures and treating other patients.  Critical care was necessary to treat or prevent imminent or life-threatening deterioration.  Critical care was time spent personally by me on the following activities: development of treatment plan with patient and/or surrogate as well as nursing, discussions with consultants, evaluation of patient's response to treatment, examination of patient, obtaining history from patient or surrogate, ordering and performing treatments and interventions, ordering and review of laboratory studies, ordering and review of radiographic studies, pulse oximetry and re-evaluation of patient's condition. ____________________________________________   INITIAL IMPRESSION / ASSESSMENT AND PLAN / ED COURSE  Pertinent labs & imaging results that were available during my care of the patient were reviewed by me and considered in my medical decision making (see chart for details).  Patient presents with severe shortness of breath, with tachycardia with heart rates in the 160s. Per history of aortic stenosis per family. Initially blood pressure was stable, attempted 5 mg of metoprolol as I suspect her elevated heart rate causing her shortness of breath. This did help briefly. However gradually her heart rate increased and her  blood pressure began to trend down. IV fluids were started. Despite this blood pressure continued to decrease slowly. I had extensive discussions with family. Patient is DO NOT RESUSCITATE/DO NOT INTUBATE. I offered cardioversion but they do not want this done. They do not want vasopressors. They do not want central lines.  Patient is on BiPAP and they're okay with this. They would like to continue fluids and  BiPAP and admit to the hospital with a focus on keeping the patient comfortable to see if fluids and BiPAP help her improve  Discussed with Dr. Doy Hutching who will admit the patient    ____________________________________________   FINAL CLINICAL IMPRESSION(S) / ED DIAGNOSES  Final diagnoses:  Atrial fibrillation with RVR ([place])  Aortic valve stenosis, etiology of cardiac valve disease unspecified  Acute respiratory failure with hypoxia ([place])      NEW MEDICATIONS STARTED DURING THIS VISIT:  New Prescriptions   No medications on file     Note:  This document was prepared using Dragon voice recognition software and may include unintentional dictation errors.    Lavonia Drafts, MD 06/26/2017 1409

## 2017-07-15 LAB — GLUCOSE, CAPILLARY: Glucose-Capillary: 168 mg/dL — ABNORMAL HIGH (ref 65–99)

## 2017-07-15 MED ORDER — MORPHINE SULFATE 10 MG/5ML PO SOLN
5.0000 mg | ORAL | Status: DC | PRN
  Administered 2017-07-15 (×2): 5 mg via ORAL
  Filled 2017-07-15 (×2): qty 5

## 2017-07-19 LAB — CULTURE, BLOOD (ROUTINE X 2)
CULTURE: NO GROWTH
Culture: NO GROWTH
SPECIAL REQUESTS: ADEQUATE
SPECIAL REQUESTS: ADEQUATE

## 2017-07-24 NOTE — Progress Notes (Signed)
Pt without respirations or heartbeat-emotional support given to both daughters.

## 2017-07-24 NOTE — Progress Notes (Signed)
[facility] notified-no suitable for donation

## 2017-07-24 NOTE — Death Summary Note (Signed)
DEATH SUMMARY   Patient Details  Name: Tanya Townsend MRN: [id number] DOB: 09/08/1922  Admission/Discharge Information   Admit Date:  01-Aug-2017  Date of Death: Date of Death: August 02, 2017  Time of Death: Time of Death: 0354  Length of Stay: 1  Referring Physician: [facility]   Reason(s) for Hospitalization   SOB Diagnoses  Preliminary cause of death:  Secondary Diagnoses (including complications and co-morbidities):  Active Problems:   Acute respiratory failure [facility])   Brief Hospital Course (including significant findings, care, treatment, and services provided and events leading to death)  83 year old female with chronic atrial fibrillation, AVM with history of GI bleed currently in hospice care who presents with acute hypoxic respiratory failure and atrial fibrillation with RVR.  1. Acute hypoxic respiratory failure in the setting of pulmonary edema and atrial fibrillation with RVR Patient was transitioned from [facility] to [facility] and she was made comfort care by her family on admission due to overall poor outlook.   2. Acute on chronic diastolic heart failure:   3. Severe aortic stenosis  4. Atrial fibrillation with RVR: Family wanted comfort care  5. Acute kidney injury:  6. Elevated troponin due to demand ischemia     Pertinent Labs and Studies  Significant Diagnostic Studies Dg Chest Portable 1 View  Result Date: Aug 01, 2017 CLINICAL DATA:  Unresponsive, history coronary artery disease post MI, atrial fibrillation, hypertension, diabetes mellitus, stroke EXAM: PORTABLE CHEST 1 VIEW COMPARISON:  Portable exam 04-09-1217 hours compared to 02/27/2017 FINDINGS: Enlargement of cardiac silhouette with minimal pulmonary vascular congestion. Atherosclerotic calcification aorta. RIGHT pleural effusion and basilar atelectasis. Central peribronchial thickening and question minimal perihilar edema RIGHT greater than LEFT. Probable small LEFT pleural effusion as  well. Upper lungs clear. No pneumothorax. Bones demineralized. IMPRESSION: Enlargement of cardiac silhouette with pulmonary vascular congestion and question minimal pulmonary edema. RIGHT pleural effusion and basilar atelectasis with probable smaller LEFT pleural effusion. Aortic Atherosclerosis (ICD10-I70.0). Electronically Signed   By: Lavonia Dana M.D.   On: August 01, 2017 12:38    Microbiology Recent Results (from the past 240 hour(s))  Blood culture (routine x 2)     Status: None (Preliminary result)   Collection Time: 08/01/17  1:30 PM  Result Value Ref Range Status   Specimen Description BLOOD BLOOD RIGHT HAND  Final   Special Requests   Final    BOTTLES DRAWN AEROBIC ONLY Blood Culture adequate volume   Culture NO GROWTH < 24 HOURS  Final   Report Status PENDING  Incomplete  Blood culture (routine x 2)     Status: None (Preliminary result)   Collection Time: Aug 01, 2017  1:43 PM  Result Value Ref Range Status   Specimen Description BLOOD LEFT ANTECUBITAL  Final   Special Requests   Final    BOTTLES DRAWN AEROBIC ONLY Blood Culture adequate volume   Culture NO GROWTH < 24 HOURS  Final   Report Status PENDING  Incomplete    Lab Basic Metabolic Panel:  Recent Labs Lab 01-Aug-2017 1240  NA 144  K 5.0  CL 112*  CO2 11*  GLUCOSE 236*  BUN 27*  CREATININE 1.43*  CALCIUM 9.4   Liver Function Tests:  Recent Labs Lab 2017/08/01 1240  AST 57*  ALT 15  ALKPHOS 82  BILITOT 0.7  PROT 6.4*  ALBUMIN 3.4*   No results for input(s): LIPASE, AMYLASE in the last 168 hours. No results for input(s): AMMONIA in the last 168 hours. CBC:  Recent Labs Lab 08/01/17 1343  WBC  SPECIMEN CLOTTED  HGB SPECIMEN CLOTTED  HCT SPECIMEN CLOTTED  MCV SPECIMEN CLOTTED  PLT SPECIMEN CLOTTED   Cardiac Enzymes:  Recent Labs Lab 06/28/2017 1240  TROPONINI 0.20*   Sepsis Labs:  Recent Labs Lab 07/12/2017 1343  WBC SPECIMEN CLOTTED    Procedures/Operations  none   Tanya Townsend 07/18/2017,  11:56 AM

## 2017-07-24 DEATH — deceased

## 2017-08-07 ENCOUNTER — Other Ambulatory Visit: Payer: Self-pay | Admitting: Nurse Practitioner

## 2017-10-24 IMAGING — CR DG CHEST 2V
1 series · 2 of 2 positions shown · non-contrast
Comparison: 01/26/2016 chest radiograph.

CLINICAL DATA: Cough for 2 days

EXAM:
CHEST  2 VIEW

[Series 1: w chest pa · 0.14mm/px · 2 of 2 slices shown]
[im 1/2]
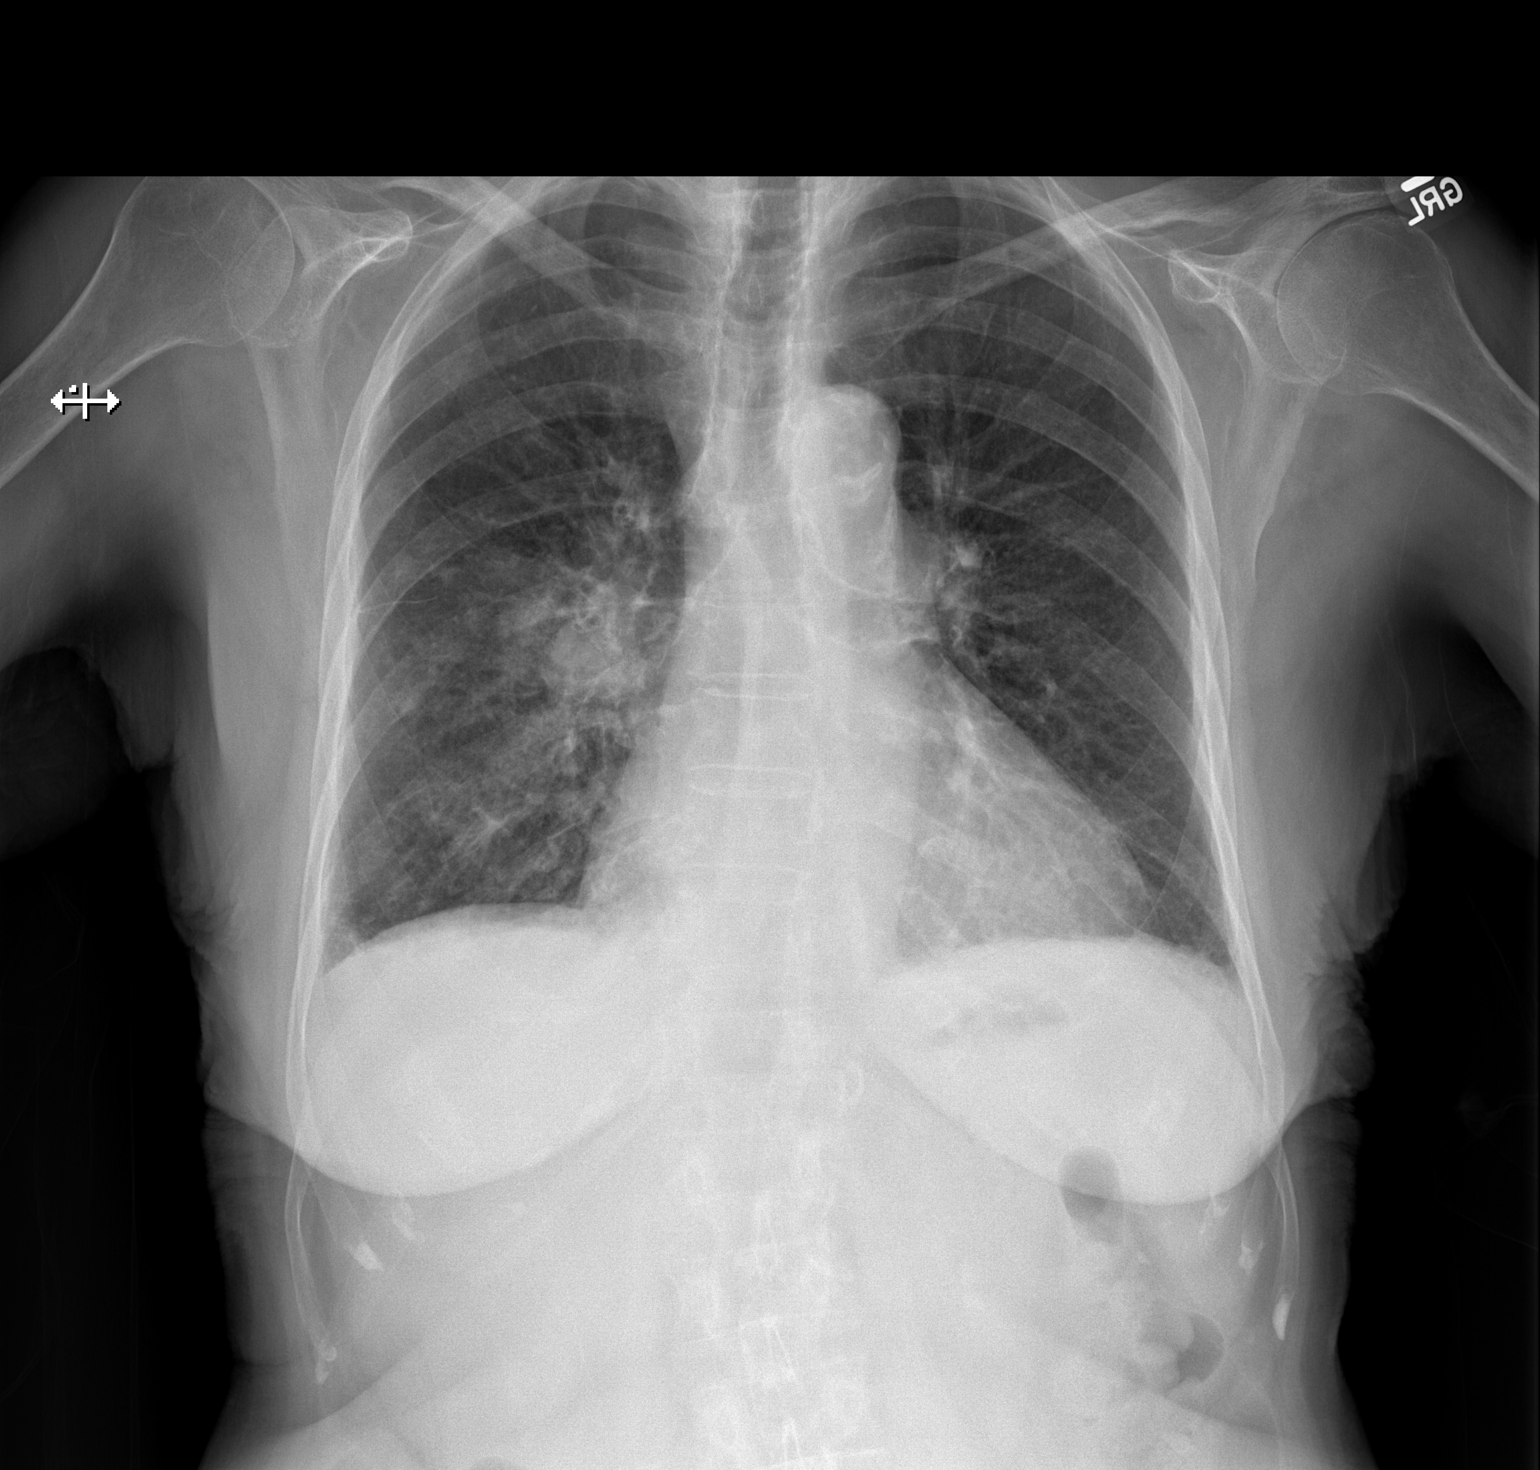
[im 2/2]
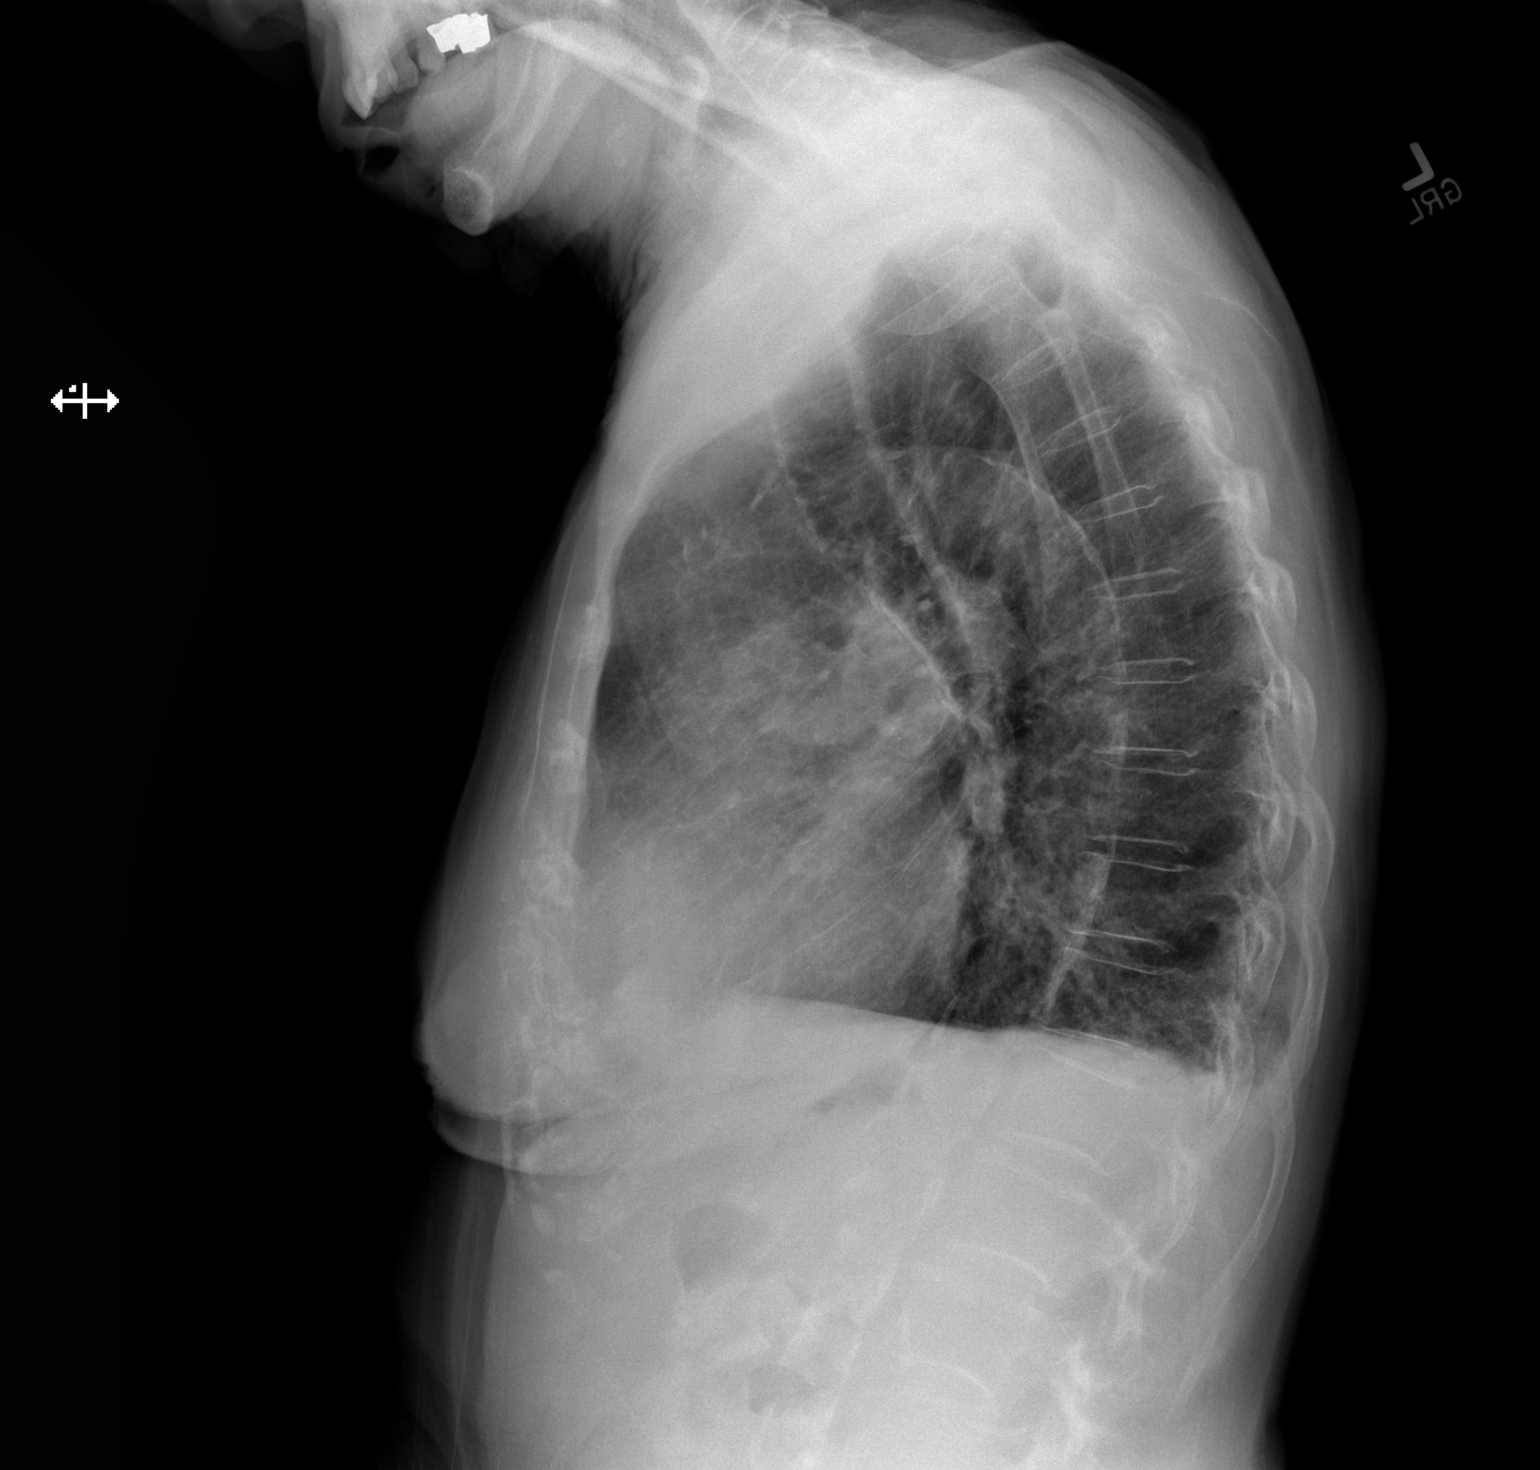

[2 of 2 positions shown; findings below may reference images not displayed]

FINDINGS: Stable cardiomediastinal silhouette with normal heart size and
aortic atherosclerosis. No pneumothorax. No pleural effusion. New
patchy right lower parahilar lung opacity.
IMPRESSION: 1. New patchy right lower parahilar lung opacity, which may
represent a pneumonia. Recommend follow-up PA and lateral post
treatment chest radiographs in 4-6 weeks.
2. Aortic atherosclerosis.
